# Patient Record
Sex: Female | Born: 1985 | Race: White | Hispanic: No | Marital: Married
Health system: Southern US, Community
[De-identification: ages and names within clinical notes are randomized; demographics above are authoritative.]

## PROBLEM LIST (undated history)

## (undated) ENCOUNTER — Inpatient Hospital Stay (HOSPITAL_COMMUNITY): Payer: Self-pay

## (undated) DIAGNOSIS — D497 Neoplasm of unspecified behavior of endocrine glands and other parts of nervous system: Secondary | ICD-10-CM

## (undated) DIAGNOSIS — I471 Supraventricular tachycardia: Secondary | ICD-10-CM

## (undated) HISTORY — PX: KNEE SURGERY: SHX244

## (undated) HISTORY — PX: TONSILLECTOMY: SUR1361

## (undated) HISTORY — PX: OTHER SURGICAL HISTORY: SHX169

---
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
## 2012-09-09 ENCOUNTER — Inpatient Hospital Stay (HOSPITAL_COMMUNITY)
Admission: AD | Admit: 2012-09-09 | Discharge: 2012-09-09 | Disposition: A | Discharge status: Home / Self Care | Payer: BC Managed Care – PPO | Source: Ambulatory Visit | Attending: Obstetrics and Gynecology | Admitting: Obstetrics and Gynecology

## 2012-09-09 ENCOUNTER — Inpatient Hospital Stay (HOSPITAL_COMMUNITY): Payer: BC Managed Care – PPO

## 2012-09-09 ENCOUNTER — Encounter (HOSPITAL_COMMUNITY): Payer: Self-pay

## 2012-09-09 DIAGNOSIS — O34599 Maternal care for other abnormalities of gravid uterus, unspecified trimester: Secondary | ICD-10-CM | POA: Insufficient documentation

## 2012-09-09 DIAGNOSIS — N83299 Other ovarian cyst, unspecified side: Secondary | ICD-10-CM

## 2012-09-09 DIAGNOSIS — N83209 Unspecified ovarian cyst, unspecified side: Secondary | ICD-10-CM | POA: Insufficient documentation

## 2012-09-09 DIAGNOSIS — Z298 Encounter for other specified prophylactic measures: Secondary | ICD-10-CM | POA: Insufficient documentation

## 2012-09-09 DIAGNOSIS — O4690 Antepartum hemorrhage, unspecified, unspecified trimester: Secondary | ICD-10-CM

## 2012-09-09 DIAGNOSIS — Z2989 Encounter for other specified prophylactic measures: Secondary | ICD-10-CM | POA: Insufficient documentation

## 2012-09-09 HISTORY — DX: Supraventricular tachycardia: I47.1

## 2012-09-09 HISTORY — DX: Neoplasm of unspecified behavior of endocrine glands and other parts of nervous system: D49.7

## 2012-09-09 LAB — CBC
Hemoglobin: 11.6 g/dL — ABNORMAL LOW (ref 12.0–15.0)
MCH: 29.4 pg (ref 26.0–34.0)
MCHC: 35 g/dL (ref 30.0–36.0)
MCV: 84 fL (ref 78.0–100.0)
RBC: 3.94 MIL/uL (ref 3.87–5.11)

## 2012-09-09 LAB — URINALYSIS, ROUTINE W REFLEX MICROSCOPIC
Bilirubin Urine: NEGATIVE
Hgb urine dipstick: NEGATIVE
Nitrite: NEGATIVE
Specific Gravity, Urine: <1.005 — ABNORMAL LOW (ref 1.005–1.030)
Urobilinogen, UA: 0.2 mg/dL (ref 0.0–1.0)
pH: 5.5 (ref 5.0–8.0)

## 2012-09-09 LAB — WET PREP, GENITAL
Trich, Wet Prep: NONE SEEN
Yeast Wet Prep HPF POC: NONE SEEN

## 2012-09-09 LAB — POCT PREGNANCY, URINE: Preg Test, Ur: POSITIVE — AB

## 2012-09-09 MED ORDER — RHO D IMMUNE GLOBULIN 1500 UNIT/2ML IJ SOLN: 300.0000 ug | Freq: Once | INTRAMUSCULAR | Status: DC

## 2012-09-09 MED ORDER — RHO D IMMUNE GLOBULIN 1500 UNIT/2ML IJ SOLN
300.0000 ug | Freq: Once | INTRAMUSCULAR | Status: AC
  Administered 2012-09-09: 300 ug via INTRAMUSCULAR
  Filled 2012-09-09: qty 2

## 2012-09-09 NOTE — MAU Provider Note (Signed)
History     CSN: [id number]  Arrival date and time: 09/09/12 0101   First Provider Initiated Contact with Patient 09/09/12 0124      Chief Complaint  Patient presents with  . Vaginal Bleeding   HPI  Pt is here with report of vaginal bleeding today.  Bleeding is described as scant on tissue - dark, yellow brown.  Now the discharge is described as pink.  No report of vaginal odor.  Last intercourse greater than one week ago.  No UTI symptoms.  Pt has an initial appointment scheduled for 09/13/12.    Past Medical History  Diagnosis Date  . Pituitary tumor   . SVT (supraventricular tachycardia)     Past Surgical History  Procedure Date  . Catheter ablation   . Knee surgery   . Tonsillectomy     No family history on file.  History  Substance Use Topics  . Smoking status: Not on file  . Smokeless tobacco: Not on file  . Alcohol Use: No    Allergies: Allergies not on file  No prescriptions prior to admission    Review of Systems  Gastrointestinal: Positive for abdominal pain (mild, intermittent cramping).  Genitourinary:       Vaginal bleeding  All other systems reviewed and are negative.   Physical Exam   Blood pressure 116/77, pulse 113, temperature 98.2 F (36.8 C), temperature source Oral, resp. rate 16, height 5' 8.5" (1.74 m), weight 70.489 kg (155 lb 6.4 oz), last menstrual period 07/28/2012.  Physical Exam  Constitutional: She is oriented to person, place, and time. She appears well-developed and well-nourished. No distress.  HENT:  Head: Normocephalic.  Neck: Normal range of motion. Neck supple.  Cardiovascular: Normal rate, regular rhythm and normal heart sounds.   Respiratory: Effort normal and breath sounds normal. No respiratory distress.  GI: Soft. There is no tenderness.  Genitourinary: Uterus is enlarged. There is bleeding ( scant; os closed, no clots seen) around the vagina.  Musculoskeletal: Normal range of motion. She exhibits no edema.    Neurological: She is alert and oriented to person, place, and time.  Skin: Skin is warm and dry.    MAU Course  Procedures  Results for orders placed during the hospital encounter of 09/09/12 (from the past 24 hour(s))  URINALYSIS, ROUTINE W REFLEX MICROSCOPIC     Status: Abnormal   Collection Time   09/09/12  1:12 AM      Component Value Range   Color, Urine YELLOW  YELLOW   APPearance CLEAR  CLEAR   Specific Gravity, Urine <1.005 (*) 1.005 - 1.030   pH 5.5  5.0 - 8.0   Glucose, UA NEGATIVE  NEGATIVE mg/dL   Hgb urine dipstick NEGATIVE  NEGATIVE   Bilirubin Urine NEGATIVE  NEGATIVE   Ketones, ur NEGATIVE  NEGATIVE mg/dL   Protein, ur NEGATIVE  NEGATIVE mg/dL   Urobilinogen, UA 0.2  0.0 - 1.0 mg/dL   Nitrite NEGATIVE  NEGATIVE   Leukocytes, UA NEGATIVE  NEGATIVE  POCT PREGNANCY, URINE     Status: Abnormal   Collection Time   09/09/12  1:23 AM      Component Value Range   Preg Test, Ur POSITIVE (*) NEGATIVE  WET PREP, GENITAL     Status: Abnormal   Collection Time   09/09/12  1:30 AM      Component Value Range   Yeast Wet Prep HPF POC NONE SEEN  NONE SEEN   Trich, Wet Prep NONE SEEN  NONE SEEN   Clue Cells Wet Prep HPF POC NONE SEEN  NONE SEEN   WBC, Wet Prep HPF POC FEW (*) NONE SEEN  CBC     Status: Abnormal   Collection Time   09/09/12  1:35 AM      Component Value Range   WBC 7.3  4.0 - 10.5 K/uL   RBC 3.94  3.87 - 5.11 MIL/uL   Hemoglobin 11.6 (*) 12.0 - 15.0 g/dL   HCT 45.4 (*) 09.8 - 11.9 %   MCV 84.0  78.0 - 100.0 fL   MCH 29.4  26.0 - 34.0 pg   MCHC 35.0  30.0 - 36.0 g/dL   RDW 14.7  82.9 - 56.2 %   Platelets 227  150 - 400 K/uL  HCG, QUANTITATIVE, PREGNANCY     Status: Abnormal   Collection Time   09/09/12  1:35 AM      Component Value Range   hCG, Beta Chain, Quant, S 537 (*) <5 mIU/mL  ABO/RH     Status: Normal   Collection Time   09/09/12  1:35 AM      Component Value Range   ABO/RH(D) A NEG    RH IG WORKUP (INCLUDES ABO/RH)     Status: Normal  (Preliminary result)   Collection Time   09/09/12  1:35 AM      Component Value Range   Gestational Age(Wks) 6     ABO/RH(D) A NEG     Antibody Screen NEG     Unit Number [id number]     Blood Component Type RHIG     Unit division 00     Status of Unit ALLOCATED     Transfusion Status OK TO TRANSFUSE     Ultrasound: IMPRESSION:  Single possible small intrauterine gestational sac noted, with a  mean sac diameter of 3 mm, corresponding to a gestational age of [redacted]  weeks 6 days. This does not match the gestational age of [redacted] weeks 2  days by LMP, though it does match the quantitative beta HCG level  of 537. No yolk sac or embryo yet seen.  There is a large mildly complex cyst at the right ovary, measuring  approximately 6.8 x 5.6 x 5.6 cm, demonstrating mild thin  septations. No associated blood flow is seen. The right ovary  measures approximately 7.7 x 5.6 x 6.2 cm. The left ovary measures  3.8 x 1.1 x 2.2 cm, and is unremarkable in appearance    Assessment and Plan  Right Ovarian Cyst Intrauterine Pregnancy RH negative  Plan:   Consulted with Dr. Dareen Piano > have pt follow-up next week Rhogam IM Bleeding precautions [facility]   [facility] 09/09/2012, 1:25 AM

## 2012-09-10 LAB — RH IG WORKUP (INCLUDES ABO/RH)
ABO/RH(D): A NEG
Antibody Screen: NEGATIVE
Unit division: 0

## 2013-07-15 ENCOUNTER — Encounter (HOSPITAL_COMMUNITY): Payer: Self-pay | Admitting: *Deleted

## 2014-08-11 ENCOUNTER — Encounter (HOSPITAL_COMMUNITY): Payer: Self-pay | Admitting: *Deleted

## 2016-03-09 ENCOUNTER — Telehealth (HOSPITAL_COMMUNITY): Payer: Self-pay | Admitting: Lactation Services

## 2016-03-09 NOTE — Telephone Encounter (Signed)
Dr. Monna Fam of [place] called to see how he could further assist his patient's breastfeeding mother, who is 3 months postpartum, and is experiencing an overactive letdown. Dr. Janann Colonel felt that patient's mother, Glenda Hines,  would be amenable to receiving a call from lactation and making an LC appt.  I called & spoke w/Mom, who says that "Mallie Mussel" chokes and refuses the breast at feedings except at night. Mom has tried laid-back nursing without success. She did use a nipple shield, which worked for 1 week, but then it lost its efficacy. An appt was made for Thursday, June 8th, at [place] has had excellent weight gain. He is 84 months old & is 7 lbs above birth weight. Mom can pump 4-5 oz off of one breast & Mom says Mallie Mussel never needs to go to the 2nd side. Mom admits that a small component of the difficulty in feeding Mallie Mussel was that she thought that he needed to eat "every couple of hours." I reassured Mom that in feeding situations such as this, infants may easily go 4 hrs between feedings.   We were unable to finish our phone conversation secondary to [place] beginning to cry while they were riding in the car, but Mom gave me permission to call her back. I did call her back about 1.5 hours later, but could not reach her. I left a message welcoming her to call me back, if she desired.   Elinor Dodge, RN, IBCLC

## 2016-03-17 ENCOUNTER — Ambulatory Visit (HOSPITAL_COMMUNITY)
Admission: RE | Admit: 2016-03-17 | Discharge: 2016-03-17 | Disposition: A | Discharge status: Home / Self Care | Payer: BC Managed Care – PPO | Source: Ambulatory Visit | Attending: Obstetrics & Gynecology | Admitting: Obstetrics & Gynecology

## 2016-03-17 NOTE — Lactation Note (Signed)
Lactation Consult  Mother's reason for visit: help with managing over active letdown and latching on and off for more peaceful feedings  Visit Type:  Feeding assessment  Appointment Notes:  Quick let down , and baby chokes at the breast  Consult:  Initial Lactation Consultant:  Myer Haff  ___________________________________________________________________________________________________________________________________________  Mother's Name: Susann Givens Type of delivery:  Vaginal  Breastfeeding Experience:  1st baby  Maternal Medical Conditions:   Maternal Medications:  PNV   ________________________________________________________________________  Breastfeeding History (Post Discharge)  Frequency of breastfeeding: every day - sometimes every 3-4 hours  Duration of feeding:  30-45 mins   Supplementing - no  Pumping: DEBP Medela - occasionally when needed for over fullness   Infant Intake and Output Assessment  Voids:  6-8 24 hrs.  Color:  Clear yellow Stools:  3 n 24 hrs.  Color:  Yellow  ________________________________________________________________________  Maternal Breast Assessment  Breast:  Full Nipple:  Erect Pain level:  0 Pain interventions:  Expressed breast milk  _______________________________________________________________________ Feeding Assessment/Evaluation  Initial feeding assessment:  Infant's oral assessment:  Variance see LC note below     Pre-feed weight:  7634 g , 16.13 oz  Post-feed weight:   Amount transferre Amount supplemented:  60 ml EBM  Total amount pumped post feed:  Did not post pump   Total amount transferred:  ( didn't re-weight due to not latching long enough to count it has a a feeding.  Total supplement given:  60 ml    Lactation Impression:  1st baby for this mom -  72 / mom / grandmother at [facility] consult - last feeding at home per mom - 2-5 pm - on and off snack at the breast.  Mom reports she delivered  at [facility] , good breast changes and milk came in without problems with in 2 days post delivery.  Delivery 12/01/15 - BW 9-11oz , baby is now 62 months old last weight 6-12 oz at the [facility] - baby is gaining well - last week. Vaginal delivery without complications , and no issues with latching in the hospital.  @ home latching has been a struggle and once he settles down he latches .  Per mom often takes standing up , rocking him back and forth , giving him a pacifier to calm him down, wait until he is a alittle sleepy, easy  The pacifier out and latch him . The best he does to stay latched is 15 - 20 mins. He also gets choky with my quick let down.  Tried all sorts of bottle nipples and the "[place] bottle seems to work the best , but it takes a long time and the best grandmother has done is 2 oz for a feeding.  From what LC could gather from the conversation with mom and grandmother feedings at the breast are a challenging and that's why the consult was scheduled by mom.   Baby awake and calm to start , even with weight , undressing for feeding and oral assessment.  LC observed mom latching in the cradle position and baby latched for a short interval and then would pull off and get very fussy.  [place] assisted all 5 different breast  Feeding positions without success. Baby very fussy. But very calm and smiles when away from the breast,  LC even assisted mom to hand express of the 1st breast so let down once has strong and baby pulled away , gagging , and very fussy.  Per mom -  requested LC try a bottle - LC started with moms "[place] bottle " she brought from home" . Baby only would stay latched on the bottle  For short interval , gag and come off. LC changed to hospital yellow nipple , and he seemed to a llitle better and sustain latch for longer , then same  behavior noted.  [place] then tried a Special Medela feeder with a longer nipple and slowly Mallie Mussel took 60 ml. Occasionally during the feeding  started rolling tongue and loosing suction, . LC would re-position.  And he was able to stay calm. [place] suspects he has a short labial frenulum and a posterior short frenulum. He is able to raise his tongue , but not above the corners of the mouth and over the gum line slightly, also a high palate.   LC highly recommends this baby be assessed by and oral specialist - resource sheet given to mom and discussion took place at consult   Lactation Plan of Care :  Until this latching issue is figured out - if breast 2nd breast is still full release down to comfort.  Protect established milk supply  If breast are really full to start - release off the 2nd breast so Mallie Mussel is more comfortable latching.  Try NS for latching to slow down the let down  Special feeder as directed by [facility] at consult - seems to work well.  Consider exploring frenulum resource sheet handout.

## 2017-10-06 DIAGNOSIS — O09893 Supervision of other high risk pregnancies, third trimester: Secondary | ICD-10-CM | POA: Diagnosis not present

## 2017-10-06 DIAGNOSIS — O0993 Supervision of high risk pregnancy, unspecified, third trimester: Secondary | ICD-10-CM | POA: Diagnosis not present

## 2017-10-06 DIAGNOSIS — Z6791 Unspecified blood type, Rh negative: Secondary | ICD-10-CM | POA: Diagnosis not present

## 2017-10-12 DIAGNOSIS — O99810 Abnormal glucose complicating pregnancy: Secondary | ICD-10-CM | POA: Diagnosis not present

## 2017-11-23 DIAGNOSIS — O2623 Pregnancy care for patient with recurrent pregnancy loss, third trimester: Secondary | ICD-10-CM | POA: Diagnosis not present

## 2017-11-23 DIAGNOSIS — O0993 Supervision of high risk pregnancy, unspecified, third trimester: Secondary | ICD-10-CM | POA: Diagnosis not present

## 2017-11-23 DIAGNOSIS — Z3689 Encounter for other specified antenatal screening: Secondary | ICD-10-CM | POA: Diagnosis not present

## 2017-11-23 DIAGNOSIS — Z3A36 36 weeks gestation of pregnancy: Secondary | ICD-10-CM | POA: Diagnosis not present

## 2017-11-23 DIAGNOSIS — Z6791 Unspecified blood type, Rh negative: Secondary | ICD-10-CM | POA: Diagnosis not present

## 2017-11-23 DIAGNOSIS — O09893 Supervision of other high risk pregnancies, third trimester: Secondary | ICD-10-CM | POA: Diagnosis not present

## 2017-12-14 DIAGNOSIS — O320XX0 Maternal care for unstable lie, not applicable or unspecified: Secondary | ICD-10-CM | POA: Diagnosis not present

## 2017-12-28 DIAGNOSIS — O480 Post-term pregnancy: Secondary | ICD-10-CM | POA: Diagnosis not present

## 2017-12-28 DIAGNOSIS — Z3A41 41 weeks gestation of pregnancy: Secondary | ICD-10-CM | POA: Diagnosis not present

## 2017-12-29 DIAGNOSIS — E668 Other obesity: Secondary | ICD-10-CM | POA: Diagnosis not present

## 2017-12-29 DIAGNOSIS — Z9229 Personal history of other drug therapy: Secondary | ICD-10-CM | POA: Diagnosis not present

## 2017-12-29 DIAGNOSIS — E669 Obesity, unspecified: Secondary | ICD-10-CM | POA: Diagnosis not present

## 2017-12-29 DIAGNOSIS — Z3A41 41 weeks gestation of pregnancy: Secondary | ICD-10-CM | POA: Diagnosis not present

## 2017-12-29 DIAGNOSIS — O99214 Obesity complicating childbirth: Secondary | ICD-10-CM | POA: Diagnosis not present

## 2017-12-29 DIAGNOSIS — O480 Post-term pregnancy: Secondary | ICD-10-CM | POA: Diagnosis not present

## 2017-12-29 DIAGNOSIS — Z6831 Body mass index (BMI) 31.0-31.9, adult: Secondary | ICD-10-CM | POA: Diagnosis not present

## 2017-12-29 DIAGNOSIS — O3663X0 Maternal care for excessive fetal growth, third trimester, not applicable or unspecified: Secondary | ICD-10-CM | POA: Diagnosis not present

## 2017-12-29 DIAGNOSIS — O09899 Supervision of other high risk pregnancies, unspecified trimester: Secondary | ICD-10-CM | POA: Diagnosis not present

## 2017-12-29 DIAGNOSIS — Z6791 Unspecified blood type, Rh negative: Secondary | ICD-10-CM | POA: Diagnosis not present

## 2017-12-29 DIAGNOSIS — Z6711 Type A blood, Rh negative: Secondary | ICD-10-CM | POA: Diagnosis not present

## 2017-12-29 DIAGNOSIS — O26893 Other specified pregnancy related conditions, third trimester: Secondary | ICD-10-CM | POA: Diagnosis not present

## 2018-02-07 DIAGNOSIS — Z1331 Encounter for screening for depression: Secondary | ICD-10-CM | POA: Diagnosis not present

## 2018-03-21 DIAGNOSIS — O9279 Other disorders of lactation: Secondary | ICD-10-CM | POA: Diagnosis not present

## 2018-06-07 DIAGNOSIS — D225 Melanocytic nevi of trunk: Secondary | ICD-10-CM | POA: Diagnosis not present

## 2018-06-07 DIAGNOSIS — D485 Neoplasm of uncertain behavior of skin: Secondary | ICD-10-CM | POA: Diagnosis not present

## 2018-06-07 DIAGNOSIS — D1801 Hemangioma of skin and subcutaneous tissue: Secondary | ICD-10-CM | POA: Diagnosis not present

## 2018-09-12 DIAGNOSIS — H0014 Chalazion left upper eyelid: Secondary | ICD-10-CM | POA: Diagnosis not present

## 2018-10-05 DIAGNOSIS — D485 Neoplasm of uncertain behavior of skin: Secondary | ICD-10-CM | POA: Diagnosis not present

## 2019-07-11 DIAGNOSIS — Z1151 Encounter for screening for human papillomavirus (HPV): Secondary | ICD-10-CM | POA: Diagnosis not present

## 2019-07-11 DIAGNOSIS — Z01411 Encounter for gynecological examination (general) (routine) with abnormal findings: Secondary | ICD-10-CM | POA: Diagnosis not present

## 2019-07-11 DIAGNOSIS — N858 Other specified noninflammatory disorders of uterus: Secondary | ICD-10-CM | POA: Diagnosis not present

## 2019-07-11 DIAGNOSIS — Z124 Encounter for screening for malignant neoplasm of cervix: Secondary | ICD-10-CM | POA: Diagnosis not present

## 2019-07-11 DIAGNOSIS — N925 Other specified irregular menstruation: Secondary | ICD-10-CM | POA: Diagnosis not present

## 2019-07-11 DIAGNOSIS — N949 Unspecified condition associated with female genital organs and menstrual cycle: Secondary | ICD-10-CM | POA: Diagnosis not present

## 2019-09-03 DIAGNOSIS — Z03818 Encounter for observation for suspected exposure to other biological agents ruled out: Secondary | ICD-10-CM | POA: Diagnosis not present

## 2019-09-13 DIAGNOSIS — N8111 Cystocele, midline: Secondary | ICD-10-CM | POA: Diagnosis not present

## 2019-09-13 DIAGNOSIS — N898 Other specified noninflammatory disorders of vagina: Secondary | ICD-10-CM | POA: Diagnosis not present

## 2019-09-13 DIAGNOSIS — N941 Unspecified dyspareunia: Secondary | ICD-10-CM | POA: Diagnosis not present

## 2019-11-12 DIAGNOSIS — Z3009 Encounter for other general counseling and advice on contraception: Secondary | ICD-10-CM | POA: Diagnosis not present

## 2020-01-21 ENCOUNTER — Inpatient Hospital Stay (HOSPITAL_COMMUNITY)
Admission: EM | Admit: 2020-01-21 | Discharge: 2020-01-23 | DRG: 023 | Disposition: A | Discharge status: Home / Self Care | Payer: BC Managed Care – PPO | Attending: Neurology | Admitting: Neurology

## 2020-01-21 ENCOUNTER — Inpatient Hospital Stay (HOSPITAL_COMMUNITY): Payer: BC Managed Care – PPO

## 2020-01-21 ENCOUNTER — Emergency Department (HOSPITAL_COMMUNITY): Payer: BC Managed Care – PPO

## 2020-01-21 ENCOUNTER — Encounter (HOSPITAL_COMMUNITY): Payer: Self-pay | Admitting: Neurology

## 2020-01-21 ENCOUNTER — Inpatient Hospital Stay (HOSPITAL_COMMUNITY): Payer: BC Managed Care – PPO | Admitting: Certified Registered Nurse Anesthetist

## 2020-01-21 ENCOUNTER — Encounter (HOSPITAL_COMMUNITY)
Admission: EM | Disposition: A | Discharge status: Home / Self Care | Payer: Self-pay | Source: Home / Self Care | Attending: Neurology

## 2020-01-21 DIAGNOSIS — I639 Cerebral infarction, unspecified: Secondary | ICD-10-CM

## 2020-01-21 DIAGNOSIS — R29818 Other symptoms and signs involving the nervous system: Secondary | ICD-10-CM | POA: Diagnosis not present

## 2020-01-21 DIAGNOSIS — G936 Cerebral edema: Secondary | ICD-10-CM | POA: Diagnosis present

## 2020-01-21 DIAGNOSIS — Z793 Long term (current) use of hormonal contraceptives: Secondary | ICD-10-CM | POA: Diagnosis not present

## 2020-01-21 DIAGNOSIS — I63511 Cerebral infarction due to unspecified occlusion or stenosis of right middle cerebral artery: Secondary | ICD-10-CM | POA: Diagnosis not present

## 2020-01-21 DIAGNOSIS — R9431 Abnormal electrocardiogram [ECG] [EKG]: Secondary | ICD-10-CM | POA: Diagnosis not present

## 2020-01-21 DIAGNOSIS — Z9889 Other specified postprocedural states: Secondary | ICD-10-CM | POA: Diagnosis not present

## 2020-01-21 DIAGNOSIS — G8194 Hemiplegia, unspecified affecting left nondominant side: Secondary | ICD-10-CM | POA: Diagnosis not present

## 2020-01-21 DIAGNOSIS — Z882 Allergy status to sulfonamides status: Secondary | ICD-10-CM | POA: Diagnosis not present

## 2020-01-21 DIAGNOSIS — R414 Neurologic neglect syndrome: Secondary | ICD-10-CM | POA: Diagnosis present

## 2020-01-21 DIAGNOSIS — R4781 Slurred speech: Secondary | ICD-10-CM | POA: Diagnosis not present

## 2020-01-21 DIAGNOSIS — I63521 Cerebral infarction due to unspecified occlusion or stenosis of right anterior cerebral artery: Secondary | ICD-10-CM | POA: Diagnosis not present

## 2020-01-21 DIAGNOSIS — H539 Unspecified visual disturbance: Secondary | ICD-10-CM | POA: Diagnosis not present

## 2020-01-21 DIAGNOSIS — I609 Nontraumatic subarachnoid hemorrhage, unspecified: Secondary | ICD-10-CM | POA: Diagnosis present

## 2020-01-21 DIAGNOSIS — I6389 Other cerebral infarction: Secondary | ICD-10-CM | POA: Diagnosis not present

## 2020-01-21 DIAGNOSIS — I6601 Occlusion and stenosis of right middle cerebral artery: Secondary | ICD-10-CM | POA: Diagnosis not present

## 2020-01-21 DIAGNOSIS — Z881 Allergy status to other antibiotic agents status: Secondary | ICD-10-CM | POA: Diagnosis not present

## 2020-01-21 DIAGNOSIS — Z20822 Contact with and (suspected) exposure to covid-19: Secondary | ICD-10-CM | POA: Diagnosis present

## 2020-01-21 DIAGNOSIS — Z91048 Other nonmedicinal substance allergy status: Secondary | ICD-10-CM

## 2020-01-21 DIAGNOSIS — Z91018 Allergy to other foods: Secondary | ICD-10-CM | POA: Diagnosis not present

## 2020-01-21 DIAGNOSIS — Z885 Allergy status to narcotic agent status: Secondary | ICD-10-CM | POA: Diagnosis not present

## 2020-01-21 DIAGNOSIS — Z79899 Other long term (current) drug therapy: Secondary | ICD-10-CM | POA: Diagnosis not present

## 2020-01-21 DIAGNOSIS — Q211 Atrial septal defect: Secondary | ICD-10-CM

## 2020-01-21 DIAGNOSIS — I340 Nonrheumatic mitral (valve) insufficiency: Secondary | ICD-10-CM | POA: Diagnosis not present

## 2020-01-21 DIAGNOSIS — R29810 Facial weakness: Secondary | ICD-10-CM | POA: Diagnosis not present

## 2020-01-21 DIAGNOSIS — I63411 Cerebral infarction due to embolism of right middle cerebral artery: Secondary | ICD-10-CM | POA: Diagnosis not present

## 2020-01-21 DIAGNOSIS — I959 Hypotension, unspecified: Secondary | ICD-10-CM | POA: Diagnosis not present

## 2020-01-21 DIAGNOSIS — Q2112 Patent foramen ovale: Secondary | ICD-10-CM

## 2020-01-21 DIAGNOSIS — R29714 NIHSS score 14: Secondary | ICD-10-CM | POA: Diagnosis not present

## 2020-01-21 DIAGNOSIS — R531 Weakness: Secondary | ICD-10-CM

## 2020-01-21 HISTORY — PX: RADIOLOGY WITH ANESTHESIA: SHX6223

## 2020-01-21 HISTORY — PX: IR CT HEAD LTD: IMG2386

## 2020-01-21 HISTORY — PX: IR PERCUTANEOUS ART THROMBECTOMY/INFUSION INTRACRANIAL INC DIAG ANGIO: IMG6087

## 2020-01-21 LAB — COMPREHENSIVE METABOLIC PANEL
ALT: 15 U/L (ref 0–44)
AST: 17 U/L (ref 15–41)
Albumin: 3.6 g/dL (ref 3.5–5.0)
Alkaline Phosphatase: 38 U/L (ref 38–126)
Anion gap: 10 (ref 5–15)
BUN: 10 mg/dL (ref 6–20)
CO2: 23 mmol/L (ref 22–32)
Calcium: 8.8 mg/dL — ABNORMAL LOW (ref 8.9–10.3)
Chloride: 105 mmol/L (ref 98–111)
Creatinine, Ser: 0.84 mg/dL (ref 0.44–1.00)
GFR calc Af Amer: >60 mL/min (ref >60)
GFR calc non Af Amer: >60 mL/min (ref >60)
Glucose, Bld: 154 mg/dL — ABNORMAL HIGH (ref 70–99)
Potassium: 3.6 mmol/L (ref 3.5–5.1)
Sodium: 138 mmol/L (ref 135–145)
Total Bilirubin: 0.7 mg/dL (ref 0.3–1.2)
Total Protein: 7 g/dL (ref 6.5–8.1)

## 2020-01-21 LAB — CBC
HCT: 38.5 % (ref 36.0–46.0)
Hemoglobin: 12.9 g/dL (ref 12.0–15.0)
MCH: 29.6 pg (ref 26.0–34.0)
MCHC: 33.5 g/dL (ref 30.0–36.0)
MCV: 88.3 fL (ref 80.0–100.0)
Platelets: 273 10*3/uL (ref 150–400)
RBC: 4.36 MIL/uL (ref 3.87–5.11)
RDW: 12.4 % (ref 11.5–15.5)
WBC: 5 10*3/uL (ref 4.0–10.5)
nRBC: 0 % (ref 0.0–0.2)

## 2020-01-21 LAB — I-STAT CHEM 8, ED
BUN: 11 mg/dL (ref 6–20)
Calcium, Ion: 1.15 mmol/L (ref 1.15–1.40)
Chloride: 105 mmol/L (ref 98–111)
Creatinine, Ser: 0.7 mg/dL (ref 0.44–1.00)
Glucose, Bld: 148 mg/dL — ABNORMAL HIGH (ref 70–99)
HCT: 36 % (ref 36.0–46.0)
Hemoglobin: 12.2 g/dL (ref 12.0–15.0)
Potassium: 3.7 mmol/L (ref 3.5–5.1)
Sodium: 140 mmol/L (ref 135–145)
TCO2: 26 mmol/L (ref 22–32)

## 2020-01-21 LAB — PROTIME-INR
INR: 1 (ref 0.8–1.2)
Prothrombin Time: 13.2 seconds (ref 11.4–15.2)

## 2020-01-21 LAB — I-STAT BETA HCG BLOOD, ED (MC, WL, AP ONLY): I-stat hCG, quantitative: <5 m[IU]/mL (ref <5)

## 2020-01-21 LAB — RESPIRATORY PANEL BY RT PCR (FLU A&B, COVID)
Influenza A by PCR: NEGATIVE
Influenza B by PCR: NEGATIVE
SARS Coronavirus 2 by RT PCR: NEGATIVE

## 2020-01-21 LAB — DIFFERENTIAL
Abs Immature Granulocytes: 0.01 10*3/uL (ref 0.00–0.07)
Basophils Absolute: 0 10*3/uL (ref 0.0–0.1)
Basophils Relative: 0 %
Eosinophils Absolute: 0.1 10*3/uL (ref 0.0–0.5)
Eosinophils Relative: 2 %
Immature Granulocytes: 0 %
Lymphocytes Relative: 40 %
Lymphs Abs: 2 10*3/uL (ref 0.7–4.0)
Monocytes Absolute: 0.4 10*3/uL (ref 0.1–1.0)
Monocytes Relative: 7 %
Neutro Abs: 2.6 10*3/uL (ref 1.7–7.7)
Neutrophils Relative %: 51 %

## 2020-01-21 LAB — ECHOCARDIOGRAM COMPLETE
Height: 67.5 in
Weight: 2959.46 oz

## 2020-01-21 LAB — CBG MONITORING, ED: Glucose-Capillary: 126 mg/dL — ABNORMAL HIGH (ref 70–99)

## 2020-01-21 LAB — APTT: aPTT: 24 seconds (ref 24–36)

## 2020-01-21 LAB — MRSA PCR SCREENING: MRSA by PCR: NEGATIVE

## 2020-01-21 SURGERY — RADIOLOGY WITH ANESTHESIA
Anesthesia: General

## 2020-01-21 MED ORDER — PHENYLEPHRINE 40 MCG/ML (10ML) SYRINGE FOR IV PUSH (FOR BLOOD PRESSURE SUPPORT)
PREFILLED_SYRINGE | INTRAVENOUS | Status: DC | PRN
  Administered 2020-01-21 (×6): 40 ug via INTRAVENOUS

## 2020-01-21 MED ORDER — LORAZEPAM 2 MG/ML IJ SOLN
INTRAMUSCULAR | Status: AC
  Administered 2020-01-21: 0.5 mg
  Filled 2020-01-21: qty 1

## 2020-01-21 MED ORDER — CHLORHEXIDINE GLUCONATE CLOTH 2 % EX PADS
6.0000 | MEDICATED_PAD | Freq: Every day | CUTANEOUS | Status: DC
  Administered 2020-01-21 – 2020-01-23 (×3): 6 via TOPICAL

## 2020-01-21 MED ORDER — FENTANYL CITRATE (PF) 250 MCG/5ML IJ SOLN
INTRAMUSCULAR | Status: DC | PRN
  Administered 2020-01-21 (×2): 50 ug via INTRAVENOUS

## 2020-01-21 MED ORDER — VERAPAMIL HCL 2.5 MG/ML IV SOLN
INTRAVENOUS | Status: DC | PRN
  Administered 2020-01-21 (×3): 5 mg via INTRA_ARTERIAL

## 2020-01-21 MED ORDER — ACETAMINOPHEN 650 MG RE SUPP: 650.0000 mg | RECTAL | Status: DC | PRN

## 2020-01-21 MED ORDER — SODIUM CHLORIDE 0.9 % IV SOLN: INTRAVENOUS | Status: DC

## 2020-01-21 MED ORDER — IOHEXOL 240 MG/ML SOLN
INTRAMUSCULAR | Status: AC
  Filled 2020-01-21: qty 100

## 2020-01-21 MED ORDER — IOHEXOL 300 MG/ML  SOLN
50.0000 mL | Freq: Once | INTRAMUSCULAR | Status: AC | PRN
  Administered 2020-01-21: 30 mL via INTRA_ARTERIAL

## 2020-01-21 MED ORDER — TICAGRELOR 90 MG PO TABS
ORAL_TABLET | ORAL | Status: AC
  Filled 2020-01-21: qty 2

## 2020-01-21 MED ORDER — ASPIRIN 300 MG RE SUPP: 300.0000 mg | Freq: Every day | RECTAL | Status: DC

## 2020-01-21 MED ORDER — VERAPAMIL HCL 2.5 MG/ML IV SOLN
INTRAVENOUS | Status: AC
  Filled 2020-01-21: qty 2

## 2020-01-21 MED ORDER — ROCURONIUM BROMIDE 10 MG/ML (PF) SYRINGE
PREFILLED_SYRINGE | INTRAVENOUS | Status: DC | PRN
  Administered 2020-01-21: 100 mg via INTRAVENOUS

## 2020-01-21 MED ORDER — ACETAMINOPHEN 325 MG PO TABS
650.0000 mg | ORAL_TABLET | ORAL | Status: DC | PRN
  Administered 2020-01-22 (×2): 650 mg via ORAL
  Filled 2020-01-21 (×2): qty 2

## 2020-01-21 MED ORDER — IOHEXOL 240 MG/ML SOLN
150.0000 mL | Freq: Once | INTRAMUSCULAR | Status: AC | PRN
  Administered 2020-01-21: 70 mL via INTRA_ARTERIAL

## 2020-01-21 MED ORDER — PROPOFOL 10 MG/ML IV BOLUS
INTRAVENOUS | Status: DC | PRN
  Administered 2020-01-21: 150 mg via INTRAVENOUS
  Administered 2020-01-21: 100 mg via INTRAVENOUS
  Administered 2020-01-21: 50 mg via INTRAVENOUS

## 2020-01-21 MED ORDER — STROKE: EARLY STAGES OF RECOVERY BOOK
Freq: Once | Status: DC
  Filled 2020-01-21: qty 1

## 2020-01-21 MED ORDER — NICARDIPINE HCL IN NACL 20-0.86 MG/200ML-% IV SOLN: 0.0000 mg/h | INTRAVENOUS | Status: DC

## 2020-01-21 MED ORDER — ASPIRIN 325 MG PO TABS
325.0000 mg | ORAL_TABLET | Freq: Every day | ORAL | Status: DC
Start: 2020-01-22 — End: 2020-01-21

## 2020-01-21 MED ORDER — PHENYLEPHRINE HCL-NACL 10-0.9 MG/250ML-% IV SOLN
INTRAVENOUS | Status: DC | PRN
  Administered 2020-01-21: 10 ug/min via INTRAVENOUS

## 2020-01-21 MED ORDER — LACTATED RINGERS IV SOLN: INTRAVENOUS | Status: DC | PRN

## 2020-01-21 MED ORDER — ACETAMINOPHEN 160 MG/5ML PO SOLN: 650.0000 mg | ORAL | Status: DC | PRN

## 2020-01-21 MED ORDER — SODIUM CHLORIDE 0.9% FLUSH
3.0000 mL | Freq: Once | INTRAVENOUS | Status: DC
Start: 2020-01-21 — End: 2020-01-23

## 2020-01-21 MED ORDER — ASPIRIN 81 MG PO CHEW
CHEWABLE_TABLET | ORAL | Status: AC
  Filled 2020-01-21: qty 1

## 2020-01-21 MED ORDER — CLOPIDOGREL BISULFATE 300 MG PO TABS
ORAL_TABLET | ORAL | Status: AC
  Filled 2020-01-21: qty 1

## 2020-01-21 MED ORDER — SUCCINYLCHOLINE CHLORIDE 200 MG/10ML IV SOSY
PREFILLED_SYRINGE | INTRAVENOUS | Status: DC | PRN
  Administered 2020-01-21: 120 mg via INTRAVENOUS

## 2020-01-21 MED ORDER — ASPIRIN 81 MG PO CHEW: 324.0000 mg | CHEWABLE_TABLET | ORAL | Status: DC

## 2020-01-21 MED ORDER — EPHEDRINE SULFATE-NACL 50-0.9 MG/10ML-% IV SOSY
PREFILLED_SYRINGE | INTRAVENOUS | Status: DC | PRN
  Administered 2020-01-21: 5 mg via INTRAVENOUS

## 2020-01-21 MED ORDER — ROCURONIUM BROMIDE 10 MG/ML (PF) SYRINGE: PREFILLED_SYRINGE | INTRAVENOUS | Status: DC | PRN

## 2020-01-21 MED ORDER — ONDANSETRON HCL 4 MG/2ML IJ SOLN
INTRAMUSCULAR | Status: DC | PRN
  Administered 2020-01-21: 4 mg via INTRAVENOUS

## 2020-01-21 MED ORDER — SUGAMMADEX SODIUM 200 MG/2ML IV SOLN
INTRAVENOUS | Status: DC | PRN
  Administered 2020-01-21: 200 mg via INTRAVENOUS

## 2020-01-21 MED ORDER — IOHEXOL 350 MG/ML SOLN
100.0000 mL | Freq: Once | INTRAVENOUS | Status: AC | PRN
  Administered 2020-01-21: 100 mL via INTRAVENOUS

## 2020-01-21 MED ORDER — LORAZEPAM 2 MG/ML IJ SOLN
INTRAMUSCULAR | Status: DC | PRN
  Administered 2020-01-21: 1 mg via INTRAVENOUS

## 2020-01-21 MED ORDER — LIDOCAINE 2% (20 MG/ML) 5 ML SYRINGE
INTRAMUSCULAR | Status: DC | PRN
  Administered 2020-01-21: 60 mg via INTRAVENOUS

## 2020-01-21 MED ORDER — TIROFIBAN HCL IN NACL 5-0.9 MG/100ML-% IV SOLN
INTRAVENOUS | Status: AC
  Filled 2020-01-21: qty 100

## 2020-01-21 MED ORDER — DEXAMETHASONE SODIUM PHOSPHATE 10 MG/ML IJ SOLN
INTRAMUSCULAR | Status: DC | PRN
  Administered 2020-01-21: 10 mg via INTRAVENOUS

## 2020-01-21 MED ORDER — EPTIFIBATIDE 20 MG/10ML IV SOLN
INTRAVENOUS | Status: AC
  Filled 2020-01-21: qty 10

## 2020-01-21 NOTE — Anesthesia Postprocedure Evaluation (Signed)
Anesthesia Post Note  Patient: Glenda Hines  Procedure(s) Performed: RADIOLOGY WITH ANESTHESIA (N/A )     Patient location during evaluation: PACU Anesthesia Type: General Level of consciousness: awake and alert and patient cooperative Pain management: pain level controlled Vital Signs Assessment: post-procedure vital signs reviewed and stable Respiratory status: spontaneous breathing, nonlabored ventilation and respiratory function stable Cardiovascular status: blood pressure returned to baseline, stable and tachycardic Postop Assessment: no apparent nausea or vomiting Anesthetic complications: no Comments: Tachycardic at baseline, sinus rhythm. Moving right side well and B/L LEs. Still unable to move LUE, significant facial droop on left, tongue deviation to right. Normal mentation.    Last Vitals:  Vitals:   01/21/20 0930 01/21/20 1000  BP:  136/70  Pulse:  (!) 114  Resp:  (!) 25  SpO2: 96% 100%    Last Pain: There were no vitals filed for this visit.               Pervis Hocking

## 2020-01-21 NOTE — Progress Notes (Signed)
  Echocardiogram 2D Echocardiogram has been performed.  Glenda Hines 01/21/2020, 3:59 PM

## 2020-01-21 NOTE — ED Triage Notes (Signed)
Pt in from home via [facility] as code stroke. LSN last night at bedtime (2200). EMS states she woke at 0800 w/R side gaze, L side weakness and L facial droop. States she recently stopped birth control, denies any clotting disorders or other related hx. A&ox4 on arrival, shaking and c/o anxiety, speech clear. States her R neck muscle is painful w/movement

## 2020-01-21 NOTE — Procedures (Signed)
INTERVENTIONAL NEURORADIOLOGY BRIEF POSTPROCEDURE NOTE  Glenda Hines   Attending: Dr. Pedro Earls  Assistant: None.  Diagnosis: Right MCA and right ACA occlusion  Access site: Right common femoral artery  Access closure: Perclose Proglide  Anesthesia: General  Medication used: 15 mg verapamil IA.  Complications: Small right frontal SAH  Estimated blood loss: 167mL  Specimen: None  Findings: Multiple occlusions identified in M3 /MCA anterior division and M4 posterior division. There was also occlusion of the right pericallosal artery. Mechanical thrombectomy performed with stent retriever (1 pass) and 1 aspiration in a right M3 anterior division (pre interverntion: TICI 2B; post: no change). One aspiration pass performed in the right pericallosal with complete recanalization (pre interverntion: TICI 2B; post:TICI 3).  The patient tolerated the procedure well without incident or complication and is in stable condition.   Family communicated by phone.

## 2020-01-21 NOTE — ED Provider Notes (Signed)
[facility] Provider Note   CSN: [id number] Arrival date & time: 01/21/20  [id number]  An emergency department physician performed an initial assessment on this suspected stroke patient at 0927.  History Chief Complaint  Patient presents with  . Code Stroke    Glenda Hines is a 34 y.o. female.  34 yo F with a cc of left sided weakness.  Woke up this morning and felt funny.  Noted to have significant L sided weakness and neglect and was made a code stroke with concern for LVO.   The history is provided by the patient.       Past Medical History:  Diagnosis Date  . Pituitary tumor   . SVT (supraventricular tachycardia) (HCC)     There are no problems to display for this patient.   Past Surgical History:  Procedure Laterality Date  . catheter ablation    . KNEE SURGERY    . TONSILLECTOMY       OB History    Gravida  1   Para      Term      Preterm      AB      Living        SAB      TAB      Ectopic      Multiple      Live Births              Family History  Problem Relation Age of Onset  . Hypertension Mother   . Diabetes Father     Social History   Tobacco Use  . Smoking status: Not on file  Substance Use Topics  . Alcohol use: No  . Drug use: No    Home Medications Prior to Admission medications   Not on File    Allergies    Patient has no allergy information on record.  Review of Systems   Review of Systems  Constitutional: Negative for chills and fever.  HENT: Negative for congestion and rhinorrhea.   Eyes: Negative for redness and visual disturbance.  Respiratory: Negative for shortness of breath and wheezing.   Cardiovascular: Negative for chest pain and palpitations.  Gastrointestinal: Negative for nausea and vomiting.  Genitourinary: Negative for dysuria and urgency.  Musculoskeletal: Negative for arthralgias and myalgias.  Skin: Negative for pallor and wound.  Neurological:  Positive for weakness. Negative for dizziness and headaches.    Physical Exam Updated Vital Signs BP 136/70   Pulse (!) 114   Resp (!) 25   Ht 5' 8.5" (1.74 m)   SpO2 100%   BMI 23.28 kg/m   Physical Exam Vitals and nursing note reviewed.  Constitutional:      General: She is not in acute distress.    Appearance: She is well-developed. She is not diaphoretic.  HENT:     Head: Normocephalic and atraumatic.  Eyes:     Pupils: Pupils are equal, round, and reactive to light.  Cardiovascular:     Rate and Rhythm: Normal rate and regular rhythm.     Heart sounds: No murmur. No friction rub. No gallop.   Pulmonary:     Effort: Pulmonary effort is normal.     Breath sounds: No wheezing or rales.  Abdominal:     General: There is no distension.     Palpations: Abdomen is soft.     Tenderness: There is no abdominal tenderness.  Musculoskeletal:        General:  No tenderness.     Cervical back: Normal range of motion and neck supple.  Skin:    General: Skin is warm and dry.  Neurological:     Mental Status: She is alert and oriented to person, place, and time.     Comments: Left sided neglect, Left arm contracted.    Psychiatric:        Behavior: Behavior normal.     ED Results / Procedures / Treatments   Labs (all labs ordered are listed, but only abnormal results are displayed) Labs Reviewed  COMPREHENSIVE METABOLIC PANEL - Abnormal; Notable for the following components:      Result Value   Glucose, Bld 154 (*)    Calcium 8.8 (*)    All other components within normal limits  CBG MONITORING, ED - Abnormal; Notable for the following components:   Glucose-Capillary 126 (*)    All other components within normal limits  I-STAT CHEM 8, ED - Abnormal; Notable for the following components:   Glucose, Bld 148 (*)    All other components within normal limits  RESPIRATORY PANEL BY RT PCR (FLU A&B, COVID)  PROTIME-INR  APTT  CBC  DIFFERENTIAL  CBG MONITORING, ED  I-STAT  BETA HCG BLOOD, ED ([facility], [facility], [facility] ONLY)    EKG None  Radiology CT ANGIO HEAD W OR WO CONTRAST  Result Date: 01/21/2020 CLINICAL DATA:  Code stroke. EXAM: CT ANGIOGRAPHY HEAD AND NECK CT PERFUSION BRAIN TECHNIQUE: Multidetector CT imaging of the head and neck was performed using the standard protocol during bolus administration of intravenous contrast. Multiplanar CT image reconstructions and MIPs were obtained to evaluate the vascular anatomy. Carotid stenosis measurements (when applicable) are obtained utilizing NASCET criteria, using the distal internal carotid diameter as the denominator. Multiphase CT imaging of the brain was performed following IV bolus contrast injection. Subsequent parametric perfusion maps were calculated using RAPID software. CONTRAST:  147mL OMNIPAQUE IOHEXOL 350 MG/ML SOLN COMPARISON:  Noncontrast head CT earlier today FINDINGS: CTA NECK FINDINGS Aortic arch: Unremarkable Right carotid system: Vessels are smooth and widely patent. No atheromatous changes Left carotid system: This vessels are smooth and widely patent. No atheromatous changes. Vertebral arteries: No proximal subclavian stenosis. The vertebral arteries are smooth and widely patent. Skeleton: No acute finding Other neck: No acute finding Upper chest: Negative Review of the MIP images confirms the above findings CTA HEAD FINDINGS Anterior circulation: Irregularity along the anterior wall of the right MCA bifurcation with moderate stenosis. There is a right M3 branch occlusion seen subsequently. On MIPS there is operculum ir pruning suggesting additional peripheral emboli. Non enhancement of the right pericallosal artery. Posterior circulation: Head the vertebral and basilar arteries are smooth and widely patent. Fetal type left PCA. The posterior cerebral arteries are smooth and widely patent. Negative for aneurysm Venous sinuses: Negative Anatomic variants: As above Review of the MIP images confirms the above findings  CT Brain Perfusion Findings: ASPECTS: 7 or 8 CBF (<30%) Volume: 61mL-an underestimation Perfusion (Tmax>6.0s) volume: 130 primarily in the upper division right MCA territory but also seen in the distal right ACA territory and to a lesser extent in the left frontal pole. Mismatch Volume: Not applicable Critical Value/emergent results were called by telephone at the time of interpretation on 01/21/2020 at 9:53 am to provider Leonel Ramsay, who verbally acknowledged these results. IMPRESSION: 1. No emergent large vessel occlusion but there is irregular luminal thrombus at the right M1 segment causing moderate stenosis. 2. Right M3 branch occlusion adjacent to an  acute infarct by noncontrast CT. Probable right pericallosal embolism. 3. Underestimated core infarct by CT perfusion when compared with aspects. There is a 130 cc territory of delayed/ischemic blood flow mainly in the upper division right MCA territory and right ACA territory. 4. Small area of ischemic blood flow in the left frontal pole where there was suspected cortical infarct by noncontrast CT. 5. No stenosis or embolic source seen in the neck. Electronically Signed   By: Monte Fantasia M.D.   On: 01/21/2020 10:03   CT ANGIO NECK W OR WO CONTRAST  Result Date: 01/21/2020 CLINICAL DATA:  Code stroke. EXAM: CT ANGIOGRAPHY HEAD AND NECK CT PERFUSION BRAIN TECHNIQUE: Multidetector CT imaging of the head and neck was performed using the standard protocol during bolus administration of intravenous contrast. Multiplanar CT image reconstructions and MIPs were obtained to evaluate the vascular anatomy. Carotid stenosis measurements (when applicable) are obtained utilizing NASCET criteria, using the distal internal carotid diameter as the denominator. Multiphase CT imaging of the brain was performed following IV bolus contrast injection. Subsequent parametric perfusion maps were calculated using RAPID software. CONTRAST:  184mL OMNIPAQUE IOHEXOL 350 MG/ML SOLN  COMPARISON:  Noncontrast head CT earlier today FINDINGS: CTA NECK FINDINGS Aortic arch: Unremarkable Right carotid system: Vessels are smooth and widely patent. No atheromatous changes Left carotid system: This vessels are smooth and widely patent. No atheromatous changes. Vertebral arteries: No proximal subclavian stenosis. The vertebral arteries are smooth and widely patent. Skeleton: No acute finding Other neck: No acute finding Upper chest: Negative Review of the MIP images confirms the above findings CTA HEAD FINDINGS Anterior circulation: Irregularity along the anterior wall of the right MCA bifurcation with moderate stenosis. There is a right M3 branch occlusion seen subsequently. On MIPS there is operculum ir pruning suggesting additional peripheral emboli. Non enhancement of the right pericallosal artery. Posterior circulation: Head the vertebral and basilar arteries are smooth and widely patent. Fetal type left PCA. The posterior cerebral arteries are smooth and widely patent. Negative for aneurysm Venous sinuses: Negative Anatomic variants: As above Review of the MIP images confirms the above findings CT Brain Perfusion Findings: ASPECTS: 7 or 8 CBF (<30%) Volume: 40mL-an underestimation Perfusion (Tmax>6.0s) volume: 130 primarily in the upper division right MCA territory but also seen in the distal right ACA territory and to a lesser extent in the left frontal pole. Mismatch Volume: Not applicable Critical Value/emergent results were called by telephone at the time of interpretation on 01/21/2020 at 9:53 am to provider Leonel Ramsay, who verbally acknowledged these results. IMPRESSION: 1. No emergent large vessel occlusion but there is irregular luminal thrombus at the right M1 segment causing moderate stenosis. 2. Right M3 branch occlusion adjacent to an acute infarct by noncontrast CT. Probable right pericallosal embolism. 3. Underestimated core infarct by CT perfusion when compared with aspects. There is  a 130 cc territory of delayed/ischemic blood flow mainly in the upper division right MCA territory and right ACA territory. 4. Small area of ischemic blood flow in the left frontal pole where there was suspected cortical infarct by noncontrast CT. 5. No stenosis or embolic source seen in the neck. Electronically Signed   By: Monte Fantasia M.D.   On: 01/21/2020 10:03   CT CEREBRAL PERFUSION W CONTRAST  Result Date: 01/21/2020 CLINICAL DATA:  Code stroke. EXAM: CT ANGIOGRAPHY HEAD AND NECK CT PERFUSION BRAIN TECHNIQUE: Multidetector CT imaging of the head and neck was performed using the standard protocol during bolus administration of intravenous contrast. Multiplanar CT  image reconstructions and MIPs were obtained to evaluate the vascular anatomy. Carotid stenosis measurements (when applicable) are obtained utilizing NASCET criteria, using the distal internal carotid diameter as the denominator. Multiphase CT imaging of the brain was performed following IV bolus contrast injection. Subsequent parametric perfusion maps were calculated using RAPID software. CONTRAST:  178mL OMNIPAQUE IOHEXOL 350 MG/ML SOLN COMPARISON:  Noncontrast head CT earlier today FINDINGS: CTA NECK FINDINGS Aortic arch: Unremarkable Right carotid system: Vessels are smooth and widely patent. No atheromatous changes Left carotid system: This vessels are smooth and widely patent. No atheromatous changes. Vertebral arteries: No proximal subclavian stenosis. The vertebral arteries are smooth and widely patent. Skeleton: No acute finding Other neck: No acute finding Upper chest: Negative Review of the MIP images confirms the above findings CTA HEAD FINDINGS Anterior circulation: Irregularity along the anterior wall of the right MCA bifurcation with moderate stenosis. There is a right M3 branch occlusion seen subsequently. On MIPS there is operculum ir pruning suggesting additional peripheral emboli. Non enhancement of the right pericallosal  artery. Posterior circulation: Head the vertebral and basilar arteries are smooth and widely patent. Fetal type left PCA. The posterior cerebral arteries are smooth and widely patent. Negative for aneurysm Venous sinuses: Negative Anatomic variants: As above Review of the MIP images confirms the above findings CT Brain Perfusion Findings: ASPECTS: 7 or 8 CBF (<30%) Volume: 20mL-an underestimation Perfusion (Tmax>6.0s) volume: 130 primarily in the upper division right MCA territory but also seen in the distal right ACA territory and to a lesser extent in the left frontal pole. Mismatch Volume: Not applicable Critical Value/emergent results were called by telephone at the time of interpretation on 01/21/2020 at 9:53 am to provider Leonel Ramsay, who verbally acknowledged these results. IMPRESSION: 1. No emergent large vessel occlusion but there is irregular luminal thrombus at the right M1 segment causing moderate stenosis. 2. Right M3 branch occlusion adjacent to an acute infarct by noncontrast CT. Probable right pericallosal embolism. 3. Underestimated core infarct by CT perfusion when compared with aspects. There is a 130 cc territory of delayed/ischemic blood flow mainly in the upper division right MCA territory and right ACA territory. 4. Small area of ischemic blood flow in the left frontal pole where there was suspected cortical infarct by noncontrast CT. 5. No stenosis or embolic source seen in the neck. Electronically Signed   By: Monte Fantasia M.D.   On: 01/21/2020 10:03   CT HEAD CODE STROKE WO CONTRAST  Result Date: 01/21/2020 CLINICAL DATA:  Code stroke.  Code stroke with left-sided weakness EXAM: CT HEAD WITHOUT CONTRAST TECHNIQUE: Contiguous axial images were obtained from the base of the skull through the vertex without intravenous contrast. COMPARISON:  None. FINDINGS: Brain: Loss of gray-white differentiation along the lateral right frontal lobe and superficial right temporal lobe. Equivocal  low-density at the right putamen. Question small area of gray-white differentiation loss in the high and parasagittal left frontal lobe. No acute hemorrhage, hydrocephalus, or masslike finding. There is a cavum septum pellucidum et vergae Vascular: Hyperdense right MCA at the bifurcation and M2 level Skull: Negative Sinuses/Orbits: Gaze to the right Other: These results were communicated to Dr. Leonel Ramsay at 9:42 amon 4/13/2021by text page via the [facility] messaging system. CTA and perfusion is already pending. ASPECTS [facility] Stroke Program Early CT Score) - Ganglionic level infarction (caudate, lentiform nuclei, internal capsule, insula, M1-M3 cortex): 5/6 - Supraganglionic infarction (M4-M6 cortex): 2 Total score (0-10 with 10 being normal): 7/8 IMPRESSION: 1. Hyperdense right MCA bifurcation.  ASPECTS is 7 or 8. 2. Question small left ACA distribution cortex infarct. Electronically Signed   By: Monte Fantasia M.D.   On: 01/21/2020 09:45    Procedures Procedures (including critical care time)  Medications Ordered in ED Medications  sodium chloride flush (NS) 0.9 % injection 3 mL (has no administration in time range)  aspirin chewable tablet 324 mg (has no administration in time range)  LORazepam (ATIVAN) injection (1 mg Intravenous Given 01/21/20 1018)  iohexol (OMNIPAQUE) 350 MG/ML injection 100 mL (100 mLs Intravenous Contrast Given 01/21/20 0938)  LORazepam (ATIVAN) 2 MG/ML injection (0.5 mg  Given 01/21/20 1013)    ED Course  I have reviewed the triage vital signs and the nursing notes.  Pertinent labs & imaging results that were available during my care of the patient were reviewed by me and considered in my medical decision making (see chart for details).    MDM Rules/Calculators/A&P                      34 yo F with a cc of left-sided weakness.  Patient found to have an acute stroke.  Will be taken emergently to IR.  CRITICAL CARE Performed by: Cecilio Asper   Total  critical care time: 35 minutes  Critical care time was exclusive of separately billable procedures and treating other patients.  Critical care was necessary to treat or prevent imminent or life-threatening deterioration.  Critical care was time spent personally by me on the following activities: development of treatment plan with patient and/or surrogate as well as nursing, discussions with consultants, evaluation of patient's response to treatment, examination of patient, obtaining history from patient or surrogate, ordering and performing treatments and interventions, ordering and review of laboratory studies, ordering and review of radiographic studies, pulse oximetry and re-evaluation of patient's condition.  The patients results and plan were reviewed and discussed.   Any x-rays performed were independently reviewed by myself.   Differential diagnosis were considered with the presenting HPI.  Medications  sodium chloride flush (NS) 0.9 % injection 3 mL (has no administration in time range)  aspirin chewable tablet 324 mg (has no administration in time range)  LORazepam (ATIVAN) injection (1 mg Intravenous Given 01/21/20 1018)  iohexol (OMNIPAQUE) 350 MG/ML injection 100 mL (100 mLs Intravenous Contrast Given 01/21/20 0938)  LORazepam (ATIVAN) 2 MG/ML injection (0.5 mg  Given 01/21/20 1013)    Vitals:   01/21/20 0900 01/21/20 0930 01/21/20 1000  BP:   136/70  Pulse:   (!) 114  Resp:   (!) 25  SpO2:  96% 100%  Height: 5' 8.5" (1.74 m)      Final diagnoses:  Stroke [facility])    Admission/ observation were discussed with the admitting physician, patient and/or family and they are comfortable with the plan.   Final Clinical Impression(s) / ED Diagnoses Final diagnoses:  Stroke [facility])    Rx / DC Orders ED Discharge Orders    None       Deno Etienne, DO 01/21/20 1040

## 2020-01-21 NOTE — H&P (Signed)
Neurology H&P  Reason for Consult: Code stroke Referring Physician: Deno Etienne  CC: Right gaze, left-sided weakness, left facial droop  History is obtained from: EMS/patient  HPI: Glenda Hines is a 34 y.o. female with no significant past medical history for stroke risk factors.  Patient states that she went to bed at approximately 2200 hrs. and was normal at that time.  She awoke at 0800 hrs. and noted she did not feel right.  She called to her husband who noticed the facial droop and right gaze.  She attempted to get up but was unable to stand.  EMS was immediately called.  Patient was brought as a code stroke.  Upon patient's arrival it was noted that patient continued to have right-sided gaze preference, left-sided neglect and left facial droop.  Patient was immediately brought to CT to obtain CT of brain, CTA of head and neck along with perfusion.  Unfortunately she was a wake-up stroke and was not capable to receive TPA.  Patient states that she does not have any family members with any clotting deficiencies that she knows of.  She denies any falls, neck pain.  She is fully functional at home prior to this.  Patient states that she was on birth control however recently stopped.  LKW: 2200 hrs. on 01/20/2020 tpa given?: no, out of window, wake-up stroke Premorbid modified Rankin scale (mRS): 0 NIH stroke score of 10  Past Medical History:  Diagnosis Date  . Pituitary tumor   . SVT (supraventricular tachycardia) (HCC)     Family History  Problem Relation Age of Onset  . Hypertension Mother   . Diabetes Father    Social History:   reports that she does not drink alcohol or use drugs. No history on file for tobacco.  Medications  Current Facility-Administered Medications:  .  aspirin chewable tablet 324 mg, 324 mg, Oral, STAT, Joylene Wescott, Vida Roller, MD .  LORazepam (ATIVAN) injection, , , PRN, Greta Doom, MD, 1 mg at 01/21/20 1018 .  sodium chloride flush (NS)  0.9 % injection 3 mL, 3 mL, Intravenous, Once, Deno Etienne, DO No current outpatient medications on file.  ROS:    General ROS: negative for - chills, fatigue, fever, night sweats, weight gain or weight loss Psychological ROS: negative for - behavioral disorder, hallucinations, memory difficulties, mood swings or suicidal ideation Ophthalmic ROS: negative for - blurry vision, double vision, eye pain or loss of vision ENT ROS: negative for - epistaxis, nasal discharge, oral lesions, sore throat, tinnitus or vertigo Allergy and Immunology ROS: negative for - hives or itchy/watery eyes Hematological and Lymphatic ROS: negative for - bleeding problems, bruising or swollen lymph nodes Respiratory ROS: negative for - cough, hemoptysis, shortness of breath or wheezing Cardiovascular ROS: negative for - chest pain, dyspnea on exertion, edema or irregular heartbeat Gastrointestinal ROS: negative for - abdominal pain, diarrhea, hematemesis, nausea/vomiting or stool incontinence Genito-Urinary ROS: negative for - dysuria, hematuria, incontinence or urinary frequency/urgency Musculoskeletal ROS: negative for - joint swelling or muscular weakness Neurological ROS: as noted in HPI Dermatological ROS: negative for rash and skin lesion changes  Exam: Current vital signs: Ht 5' 8.5" (1.74 m)   BMI 23.28 kg/m  Vital signs in last 24 hours:   Constitutional: Appears well-developed and well-nourished.  Psych: Affect appropriate to situation Eyes: No scleral injection HENT: No OP obstrucion Head: Normocephalic.  Cardiovascular: Normal rate and regular rhythm.  Respiratory: Effort normal, non-labored breathing GI: Soft.  No distension. There is no  tenderness.  Skin: WDI  Neuro: Mental Status: Patient is awake, alert, oriented to person, place, month, year, and situation. Speech-dysarthric, no difficulties with naming, repeating, comprehension.  Able to give a good history. Cranial Nerves: II:  Left-sided neglect causing hemianopsia III,IV, VI: Right gaze preference. Pupils equal, round and reactive to light V: Facial sensation is symmetric to temperature VII: Left facial droop VIII: hearing is intact to voice X: Palat elevates symmetrically XI: Shoulder shrug is symmetric. XII: tongue is midline without atrophy or fasciculations.  Motor: Right upper and lower extremity 5/5.  Left upper extremity 3/5, left lower extremity 4/5. Drift Sensory: Sensation is symmetric to light touch and temperature in the arms and leg on the right Positive left-sided neglect Deep Tendon Reflexes: 2+ and symmetric in the biceps and patellae.  Plantars: Toes are downgoing bilaterally.  Cerebellar: FNF and HKS on the right  Labs I have reviewed labs in epic and the results pertinent to this consultation are:   CBC    Component Value Date/Time   WBC 5.0 01/21/2020 0930   RBC 4.36 01/21/2020 0930   HGB 12.2 01/21/2020 0938   HCT 36.0 01/21/2020 0938   PLT 273 01/21/2020 0930   MCV 88.3 01/21/2020 0930   MCH 29.6 01/21/2020 0930   MCHC 33.5 01/21/2020 0930   RDW 12.4 01/21/2020 0930   LYMPHSABS 2.0 01/21/2020 0930   MONOABS 0.4 01/21/2020 0930   EOSABS 0.1 01/21/2020 0930   BASOSABS 0.0 01/21/2020 0930    CMP     Component Value Date/Time   NA 140 01/21/2020 0938   K 3.7 01/21/2020 0938   CL 105 01/21/2020 0938   CO2 23 01/21/2020 0930   GLUCOSE 148 (H) 01/21/2020 0938   BUN 11 01/21/2020 0938   CREATININE 0.70 01/21/2020 0938   CALCIUM 8.8 (L) 01/21/2020 0930   PROT 7.0 01/21/2020 0930   ALBUMIN 3.6 01/21/2020 0930   AST 17 01/21/2020 0930   ALT 15 01/21/2020 0930   ALKPHOS 38 01/21/2020 0930   BILITOT 0.7 01/21/2020 0930   GFRNONAA >60 01/21/2020 0930   GFRAA >60 01/21/2020 0930    Lipid Panel  No results found for: CHOL, TRIG, HDL, CHOLHDL, VLDL, LDLCALC, LDLDIRECT   Imaging I have reviewed the images obtained:  CT-scan of the brain-hyperdense right MCA  bifurcation.  Aspects is 7 or 8.  Question small left ACA distribution cortex infarct.  CTA head, neck, cerebral perfusion-no emergent large vessel occlusion but there is irregular luminal thrombus at the right M1 segment causing moderate stenosis.  Right M3 branch occlusion adjacent to acute infarct by noncontrast CT.  Probable right pericallosal embolism.  Underestimated core infarct by CT perfusion when compared with aspects.  There is 130 cc territory of delayed/ischemic blood flow mainly in the upper division right MCA territory and right ACA territory.  Small area of ischemic blood flow in the left frontal pole where there was suspected cortical infarct by noncontrast CT.  No stenosis or embolic source seen in the neck.  MRI brain without contrast-  Etta Quill PA-C [facility] [phone]  M-F  (9:00 am- 5:00 PM)  01/21/2020, 10:21 AM    Attending note:  I have seen the patient and reviewed the above note. 34 year old right-handed female who woke up with deficits consistent with a M1 occlusion.  No history of clotting disorders or other predisposing factors other than birth control.  She does, however have a history of miscarriages and was worked up with hypercoagulable panel at  that time which was negative.  She does not take any medications other than multivitamin, as she stopped birth control on Saturday.  On exam, she initially had a dense left hemiparesis, right-sided gaze, left-sided visual and sensory neglect.  After her CTA, she did improve and was able to hold her arm and leg aloft with only mild drift.  She was also able to cross midline fairly readily.  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  There was a CT change, and therefore the CT perfusion was deemed unreliable due to pseudonormalization, and therefore she was taken  to MRI emergently.  The diffusion change on MRI appeared to be less than what was ischemic by CT perfusion and the diffusion/FLAIR mismatch was significant and therefore the decision was made to proceed with thrombectomy.  Recommendations: -To IR for embolectomy - HgbA1c, fasting lipid panel - MRI of the brain without contrast after the procedure - Frequent neuro checks - Prophylactic therapy-Antiplatelet med: Aspirin - dose 325mg  PO or 300mg  PR - Risk factor modification - Telemetry monitoring - PT consult, OT consult, Speech consult - Stroke team to follow -If no obvious source is identified, likely will need repeat hypercoagulable work-up, TEE and loop  This patient is critically ill and at significant risk of neurological worsening, death and care requires constant monitoring of vital signs, hemodynamics,respiratory and cardiac monitoring, neurological assessment, discussion with family, other specialists and medical decision making of high complexity. I spent 60 minutes of neurocritical care time  in the care of  this patient. This was time spent independent of any time provided by nurse practitioner or PA.  Roland Rack, MD [facility] [phone]  If 7pm- 7am, please page neurology on call as listed in [facility]. 01/21/2020  12:14 PM

## 2020-01-21 NOTE — Transfer of Care (Signed)
Immediate Anesthesia Transfer of Care Note  Patient: Glenda Hines  Procedure(s) Performed: RADIOLOGY WITH ANESTHESIA (N/A )  Patient Location: PACU  Anesthesia Type:General  Level of Consciousness: awake, patient cooperative and responds to stimulation  Airway & Oxygen Therapy: Patient Spontanous Breathing and Patient connected to face mask oxygen  Post-op Assessment: Report given to RN, Post -op Vital signs reviewed and stable and L) facial droop, PERRLA, + movement L)LE. MDA made aware.  Post vital signs: Reviewed and stable  Last Vitals:  Vitals Value Taken Time  BP 127/69 01/21/20 1354  Temp    Pulse 121 01/21/20 1355  Resp 27 01/21/20 1355  SpO2 98 % 01/21/20 1355  Vitals shown include unvalidated device data.  Last Pain: There were no vitals filed for this visit.       Complications: No apparent anesthesia complications

## 2020-01-21 NOTE — Code Documentation (Signed)
Code Stroke Documentation  33 yo female coming from home with past medical hx of SVT w/ ablation and pituitary tumor with complaints of right sided gaze and left sided weakness that was present upon waking. [facility] called and activated a Code Stroke.   Stroke Team met patient upon arrival to the ED. EDP cleared airway, labs drawn, and patient taken to CT. CT Head completed. No hemorrhage noted - Hyperdense right MCA bifurcation.  ASPECTS is 7 or 8. CTA/CTP completed. No core noted on CTP. Due to potential pseudo normalization patient taken to MRI.   MRI Completed showing "Cortical/subcortical restricted diffusion consistent with acute ischemic changes involving portions of the right frontal lobe and right temporoparietal junction within the right MCA vascular territory. There is only minimal patchy corresponding FLAIR hyperintensity on the current exam."   Decision made to go to IR at 1035. Family called and consented at 1036. Patient arrived in IR Bay 8 at 1038. Anesthesia Intubated and transferred to IR Suite at 1105.   Not a tPA candidate due to outside window.  LKW: 2200       

## 2020-01-21 NOTE — ED Notes (Signed)
All belongings and jewelry placed in belonging bag and given to IR staff

## 2020-01-21 NOTE — Anesthesia Procedure Notes (Signed)
Procedure Name: Intubation Date/Time: 01/21/2020 11:00 AM Performed by: Verdie Drown, CRNA Pre-anesthesia Checklist: Patient identified, Emergency Drugs available, Suction available, Patient being monitored and Timeout performed Patient Re-evaluated:Patient Re-evaluated prior to induction Oxygen Delivery Method: Ambu bag Preoxygenation: Pre-oxygenation with 100% oxygen Induction Type: IV induction, Rapid sequence and Cricoid Pressure applied Ventilation: Mask ventilation without difficulty Laryngoscope Size: Glidescope and 3 Grade View: Grade I Tube type: Oral Tube size: 7.5 mm Number of attempts: 2 Airway Equipment and Method: Stylet and Video-laryngoscopy Placement Confirmation: ETT inserted through vocal cords under direct vision,  breath sounds checked- equal and bilateral and CO2 detector Secured at: 23 cm Tube secured with: Tape Dental Injury: Teeth and Oropharynx as per pre-operative assessment  Difficulty Due To: Difficult Airway- due to limited oral opening and Difficult Airway- due to anterior larynx Comments: Induction in IR intubating bay. Smooth IV induction, G2 view with MAC3-unable to pass ETT through VC. Easy mask ventilation with ambubag. DL with Glidescope MAC3 for G1 view, atraumatic intubation.

## 2020-01-21 NOTE — Anesthesia Preprocedure Evaluation (Signed)
Anesthesia Evaluation  Patient identified by MRN, date of birth, ID band Patient awake    Reviewed: Allergy & Precautions, NPO status , Patient's Chart, lab work & pertinent test results  Airway Mallampati: II  TM Distance: >3 FB Neck ROM: Full    Dental no notable dental hx.    Pulmonary neg pulmonary ROS,    Pulmonary exam normal breath sounds clear to auscultation       Cardiovascular negative cardio ROS Normal cardiovascular exam Rhythm:Regular Rate:Normal     Neuro/Psych CVA, Residual Symptoms negative psych ROS   GI/Hepatic negative GI ROS, Neg liver ROS,   Endo/Other  negative endocrine ROS  Renal/GU negative Renal ROS  negative genitourinary   Musculoskeletal negative musculoskeletal ROS (+)   Abdominal (+) + obese,   Peds negative pediatric ROS (+)  Hematology negative hematology ROS (+)   Anesthesia Other Findings Acute CVA  Reproductive/Obstetrics negative OB ROS                             Anesthesia Physical Anesthesia Plan  ASA: IV and emergent  Anesthesia Plan: General   Post-op Pain Management:    Induction: Intravenous, Rapid sequence and Cricoid pressure planned  PONV Risk Score and Plan: 3 and Ondansetron, Dexamethasone, Midazolam and Treatment may vary due to age or medical condition  Airway Management Planned: Oral ETT  Additional Equipment:   Intra-op Plan:   Post-operative Plan: Extubation in OR and Possible Post-op intubation/ventilation  Informed Consent: I have reviewed the patients History and Physical, chart, labs and discussed the procedure including the risks, benefits and alternatives for the proposed anesthesia with the patient or authorized representative who has indicated his/her understanding and acceptance.     Dental advisory given  Plan Discussed with: CRNA  Anesthesia Plan Comments:         Anesthesia Quick Evaluation

## 2020-01-22 ENCOUNTER — Inpatient Hospital Stay (HOSPITAL_COMMUNITY): Payer: BC Managed Care – PPO

## 2020-01-22 DIAGNOSIS — I639 Cerebral infarction, unspecified: Secondary | ICD-10-CM | POA: Diagnosis not present

## 2020-01-22 DIAGNOSIS — I340 Nonrheumatic mitral (valve) insufficiency: Secondary | ICD-10-CM | POA: Diagnosis not present

## 2020-01-22 DIAGNOSIS — I63411 Cerebral infarction due to embolism of right middle cerebral artery: Secondary | ICD-10-CM | POA: Diagnosis not present

## 2020-01-22 DIAGNOSIS — Z9889 Other specified postprocedural states: Secondary | ICD-10-CM | POA: Diagnosis not present

## 2020-01-22 DIAGNOSIS — I63511 Cerebral infarction due to unspecified occlusion or stenosis of right middle cerebral artery: Secondary | ICD-10-CM | POA: Diagnosis not present

## 2020-01-22 DIAGNOSIS — Q211 Atrial septal defect: Secondary | ICD-10-CM | POA: Diagnosis not present

## 2020-01-22 DIAGNOSIS — R9431 Abnormal electrocardiogram [ECG] [EKG]: Secondary | ICD-10-CM | POA: Diagnosis not present

## 2020-01-22 LAB — SEDIMENTATION RATE: Sed Rate: 10 mm/hr (ref 0–22)

## 2020-01-22 LAB — RAPID URINE DRUG SCREEN, HOSP PERFORMED
Amphetamines: NOT DETECTED
Barbiturates: NOT DETECTED
Benzodiazepines: NOT DETECTED
Cocaine: NOT DETECTED
Opiates: NOT DETECTED
Tetrahydrocannabinol: NOT DETECTED

## 2020-01-22 LAB — LIPID PANEL
Cholesterol: 245 mg/dL — ABNORMAL HIGH (ref 0–200)
HDL: 62 mg/dL (ref >40)
LDL Cholesterol: 164 mg/dL — ABNORMAL HIGH (ref 0–99)
Total CHOL/HDL Ratio: 4 RATIO
Triglycerides: 97 mg/dL (ref <150)
VLDL: 19 mg/dL (ref 0–40)

## 2020-01-22 LAB — HIV ANTIBODY (ROUTINE TESTING W REFLEX): HIV Screen 4th Generation wRfx: NONREACTIVE

## 2020-01-22 LAB — ANTITHROMBIN III: AntiThromb III Func: 126 % — ABNORMAL HIGH (ref 75–120)

## 2020-01-22 MED ORDER — CLOPIDOGREL BISULFATE 75 MG PO TABS
75.0000 mg | ORAL_TABLET | Freq: Every day | ORAL | Status: DC
  Administered 2020-01-22 – 2020-01-23 (×2): 75 mg via ORAL
  Filled 2020-01-22 (×2): qty 1

## 2020-01-22 MED ORDER — ASPIRIN EC 81 MG PO TBEC
81.0000 mg | DELAYED_RELEASE_TABLET | Freq: Every day | ORAL | Status: DC
  Administered 2020-01-22 – 2020-01-23 (×2): 81 mg via ORAL
  Filled 2020-01-22 (×2): qty 1

## 2020-01-22 MED ORDER — ATORVASTATIN CALCIUM 40 MG PO TABS
40.0000 mg | ORAL_TABLET | Freq: Every day | ORAL | Status: DC
  Administered 2020-01-22 – 2020-01-23 (×2): 40 mg via ORAL
  Filled 2020-01-22 (×2): qty 1

## 2020-01-22 MED ORDER — ENOXAPARIN SODIUM 40 MG/0.4ML ~~LOC~~ SOLN
40.0000 mg | SUBCUTANEOUS | Status: DC
  Administered 2020-01-22 – 2020-01-23 (×2): 40 mg via SUBCUTANEOUS
  Filled 2020-01-22 (×2): qty 0.4

## 2020-01-22 MED ORDER — SODIUM CHLORIDE 0.9 % IV SOLN: INTRAVENOUS | Status: DC

## 2020-01-22 NOTE — H&P (View-Only) (Signed)
STROKE TEAM PROGRESS NOTE   INTERVAL HISTORY Her husband is at the bedside. Although pt has no significant PMH, reviewed family history and further questions about clotting/hem disorders. 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 She was on BCPs for 6 weeks, just stopped these few days ago. She does note having new palpitations recently Further wk up discussed with pt/husband.  She also history of migraines with vision disturbances with last episode a month ago.  She did have a headache the night before her stroke but states this was different from her typical migraines.  She denies drinking excessive high protein energy drinks, caffeine or soda.  Vitals:   01/22/20 0700 01/22/20 0800 01/22/20 0900 01/22/20 1000  BP: (!) 177/77 113/76 123/86 114/62  Pulse:  70 84   Resp: 18 16 (!) 22 (!) 24  Temp:  98.8 F (37.1 C)    TempSrc:  Oral    SpO2:  98%  99%  Weight:      Height:        CBC:  Recent Labs  Lab 01/21/20 0930 01/21/20 0938  WBC 5.0  --   NEUTROABS 2.6  --   HGB 12.9 12.2  HCT 38.5 36.0  MCV 88.3  --   PLT 273  --     Basic Metabolic Panel:  Recent Labs  Lab 01/21/20 0930 01/21/20 0938  NA 138 140  K 3.6 3.7  CL 105 105  CO2 23  --   GLUCOSE 154* 148*  BUN 10 11  CREATININE 0.84 0.70  CALCIUM 8.8*  --    Lipid Panel:     Component Value Date/Time   CHOL 245 (H) 01/22/2020 0438   TRIG 97 01/22/2020 0438   HDL 62 01/22/2020 0438   CHOLHDL 4.0 01/22/2020 0438   VLDL 19 01/22/2020 0438   LDLCALC 164 (H) 01/22/2020 0438   HgbA1c: No results found for: HGBA1C Urine Drug Screen: No results found for: LABOPIA, COCAINSCRNUR, LABBENZ, AMPHETMU, THCU, LABBARB  Alcohol Level No results found for: ETH  IMAGING past 24 hours MR BRAIN WO CONTRAST  Result Date: 01/21/2020 CLINICAL DATA:  Right-sided gaze with left-sided weakness EXAM: MRI HEAD WITHOUT CONTRAST TECHNIQUE: Multiplanar, multiecho pulse sequences of the brain and surrounding structures were  obtained without intravenous contrast. COMPARISON:  Head CT 01/21/2020 FINDINGS: Brain: Anterior right MCA territory acute cortical infarct predominantly affecting the posterior right frontal cortex and operculum. There is also acute ischemia the right basal ganglia. Multiple smaller foci of ischemia in the posterior right hemisphere, still within the MCA territory. No acute ischemia within any other vascular territory. Normal white matter signal. Normal volume of CSF spaces. Multiple punctate foci of magnetic susceptibility effect within the right hemisphere, likely indicating petechial hemorrhage. Cavum septum pellucidum et vergae. No midline shift, ventricular entrapment or other mass effect. Vascular: Normal flow voids. Skull and upper cervical spine: Normal marrow signal. Sinuses/Orbits: Negative. Other: None. IMPRESSION: 1. Anterior right MCA territory acute infarct predominantly affecting the posterior and opercular cortices of the frontal lobe. Multiple smaller foci of ischemia in the posterior right hemisphere, still within the MCA territory. 2. Multiple punctate foci of magnetic susceptibility effect within the right hemisphere, likely indicating petechial hemorrhage. Electronically Signed   By: Ulyses Jarred M.D.   On: 01/21/2020 22:35   MR BRAIN WO CONTRAST  Result Date: 01/21/2020 CLINICAL DATA:  Stroke, follow-up. Additional history provided: 34 year old female with left-sided weakness. EXAM: MRI HEAD WITHOUT CONTRAST TECHNIQUE: Multiplanar, multiecho pulse sequences  of the brain and surrounding structures were obtained without intravenous contrast. COMPARISON:  Noncontrast head CT, CT angiogram head/neck and CT perfusion performed earlier the same day 01/21/2020 FINDINGS: Brain: A limited protocol brain MRI was performed at the ordering provider's request. Only axial and coronal diffusion-weighted imaging and an axial T2 FLAIR sequence were obtained. There is cortical/subcortical restricted  diffusion involving portions of the posterolateral right frontal lobe and right frontal operculum as well as portions of the right temporoparietal junction within the MCA vascular territory. Findings are consistent with acute ischemic changes. There is only minimal patchy corresponding FLAIR hyperintensity on the current exam. There is no significant mass effect. No midline shift. No restricted diffusion is demonstrated at site of a previously questioned acute infarction within the anterior left frontal lobe. Vascular: Poorly assessed on the acquired sequences. Skull and upper cervical spine: No focal marrow lesion identified on the acquired sequences. Sinuses/Orbits: Unremarkable appearance of the orbits on the acquired sequences. Minimal ethmoid sinus mucosal thickening. No appreciable mastoid effusion. IMPRESSION: Cortical/subcortical restricted diffusion consistent with acute ischemic changes involving portions of the right frontal lobe and right temporoparietal junction within the right MCA vascular territory. There is only minimal patchy corresponding FLAIR hyperintensity on the current exam. No restricted diffusion is demonstrated at the site of a previously questioned acute anterior left frontal lobe infarct. Electronically Signed   By: Kellie Simmering DO   On: 01/21/2020 12:04   ECHOCARDIOGRAM COMPLETE  Result Date: 01/21/2020    ECHOCARDIOGRAM REPORT   Patient Name:   Glenda Hines Date of Exam: 01/21/2020 Medical Rec #:  [id number]       Height:       67.5 in Accession #:    [id number]      Weight:       185.0 lb Date of Birth:  [birth year]       BSA:          1.967 m Patient Age:    33 years        BP:           124/74 mmHg Patient Gender: F               HR:           103 bpm. Exam Location:  Inpatient Procedure: 2D Echo, Cardiac Doppler and Color Doppler Indications:    Stroke 434.91/I163.9  History:        Patient has no prior history of Echocardiogram examinations. H/o                 SVT.   Sonographer:    Clayton Lefort RDCS (AE) Referring Phys: [phone] MCNEILL P [place]  1. Left ventricular ejection fraction, by estimation, is 60 to 65%. The left ventricle has normal function. The left ventricle has no regional wall motion abnormalities. Left ventricular diastolic parameters were normal.  2. Right ventricular systolic function is normal. The right ventricular size is normal.  3. The mitral valve is normal in structure. Trivial mitral valve regurgitation. No evidence of mitral stenosis.  4. The aortic valve is normal in structure. Aortic valve regurgitation is not visualized. No aortic stenosis is present.  5. The inferior vena cava is normal in size with greater than 50% respiratory variability, suggesting right atrial pressure of 3 mmHg. FINDINGS  Left Ventricle: Left ventricular ejection fraction, by estimation, is 60 to 65%. The left ventricle has normal function. The left ventricle has no regional wall motion abnormalities. The left ventricular  internal cavity size was normal in size. There is  no left ventricular hypertrophy. Left ventricular diastolic parameters were normal. Normal left ventricular filling pressure. Right Ventricle: The right ventricular size is normal. No increase in right ventricular wall thickness. Right ventricular systolic function is normal. Left Atrium: Left atrial size was normal in size. Right Atrium: Right atrial size was normal in size. Pericardium: There is no evidence of pericardial effusion. Mitral Valve: The mitral valve is normal in structure. Normal mobility of the mitral valve leaflets. Trivial mitral valve regurgitation. No evidence of mitral valve stenosis. Tricuspid Valve: The tricuspid valve is normal in structure. Tricuspid valve regurgitation is not demonstrated. No evidence of tricuspid stenosis. Aortic Valve: The aortic valve is normal in structure. Aortic valve regurgitation is not visualized. No aortic stenosis is present. Aortic valve mean  gradient measures 5.0 mmHg. Aortic valve peak gradient measures 9.2 mmHg. Aortic valve area, by VTI measures 2.82 cm. Pulmonic Valve: The pulmonic valve was normal in structure. Pulmonic valve regurgitation is not visualized. No evidence of pulmonic stenosis. Aorta: The aortic root is normal in size and structure. Venous: The inferior vena cava is normal in size with greater than 50% respiratory variability, suggesting right atrial pressure of 3 mmHg. IAS/Shunts: No atrial level shunt detected by color flow Doppler.  LEFT VENTRICLE PLAX 2D LVIDd:         4.56 cm LVIDs:         3.58 cm LV PW:         0.99 cm LV IVS:        1.05 cm LVOT diam:     2.20 cm LV SV:         79 LV SV Index:   40 LVOT Area:     3.80 cm  RIGHT VENTRICLE             IVC RV Basal diam:  2.43 cm     IVC diam: 1.06 cm RV S prime:     15.20 cm/s TAPSE (M-mode): 2.3 cm LEFT ATRIUM             Index       RIGHT ATRIUM           Index LA diam:        3.60 cm 1.83 cm/m  RA Area:     12.50 cm LA Vol (A2C):   31.9 ml 16.22 ml/m RA Volume:   27.30 ml  13.88 ml/m LA Vol (A4C):   25.2 ml 12.81 ml/m LA Biplane Vol: 29.8 ml 15.15 ml/m  AORTIC VALVE AV Area (Vmax):    2.85 cm AV Area (Vmean):   2.49 cm AV Area (VTI):     2.82 cm AV Vmax:           152.00 cm/s AV Vmean:          111.000 cm/s AV VTI:            0.280 m AV Peak Grad:      9.2 mmHg AV Mean Grad:      5.0 mmHg LVOT Vmax:         114.00 cm/s LVOT Vmean:        72.800 cm/s LVOT VTI:          0.208 m LVOT/AV VTI ratio: 0.74  AORTA Ao Root diam: 3.10 cm Ao Asc diam:  2.80 cm  SHUNTS Systemic VTI:  0.21 m Systemic Diam: 2.20 cm Dani Gobble Croitoru MD Electronically signed by Sanda Klein  MD Signature Date/Time: 01/21/2020/4:23:51 PM    Final     PHYSICAL EXAM Constitutional: Appears well-developed and well-nourished.  Young Caucasian lady Psych: Affect appropriate to situation Eyes: No scleral injection HENT: No OP obstrucion Head: Normocephalic.  Cardiovascular: Normal rate and regular  rhythm.  Respiratory: Effort normal, non-labored breathing GI: Soft. No distension. There is no tenderness.  Skin: WDI  Neuro: Mental Status: Patient is awake, alert, oriented to person, place, month, year, and situation. Speech is slightly dysarthric, no difficulties with naming, repeating, comprehension.  Able to give a good history. Cranial Nerves: II: partial hemianopsia III,IV, VI: Right gaze preference, but able to cross midline. Pupils equal, round and reactive to light V: Facial sensation is symmetric to temperature VII: Left facial droop VIII: hearing is intact to voice X: Palat elevates symmetrically XI: Shoulder shrug is symmetric. XII: tongue is midline without atrophy or fasciculations.  Motor: Right upper and lower extremity 5/5.  Left upper extremity 5/5, with mild drift only.  left lower extremity 5/5, w/o drift.  Mild weakness of left grip and intrinsic hand muscles.  Orbits right over left upper extremity.  Fine finger movements and foot tapping are diminished on the left compared to the right. Drift Sensory: Sensation is symmetric to light touch and temperature in the arms and leg on the right Positive left-sided neglect but mild Deep Tendon Reflexes: 2+ and symmetric in the biceps and patellae.  Plantars: Toes are downgoing bilaterally.  Cerebellar: FNF and HKS on the right  NIHSS score 4. ASSESSMENT/PLAN [name] is a 34 year old female with no previous PMH or stroke risks. Embolic infarct, unclear etiology.  There was a CT change, and therefore the CT perfusion was deemed unreliable due to pseudonormalization, and therefore she was taken to MRI emergently.  The diffusion change on MRI appeared to be less than what was ischemic by CT perfusion and the diffusion/FLAIR mismatch was significant and therefore the decision was made to proceed with thrombectomy.  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  Hyperdense RMCA at bifurcation  CTA head & neck: R M1 thrombus, R M3 occlusion. No ICA stenosis  CT perfusion deemed unreliable; pseudonormalization noted    MRI : there was diffusion change on initial MRI; f/u MRI showed diffuse RMCA infarcts, tiny petechial hemorrhage noted.  2D Echo: 60% EF, no shunt or PFO noted. Will check TCD w/bubble and TEE as well as LE [place].   LDL 164  HgbA1c No results found for requested labs within last 26280 hours.  Lovenox for VTE prophylaxis    Diet   Diet Heart Room service appropriate? Yes; Fluid consistency: Thin     none prior to admission, now on ASA + Plavix for 3 weeks  Therapy recommendations: DAPT x3wk, then monotherapy with ASA only (unless need for [facility] is found)  Disposition:  tbd  NO known Hypertension . Long-term BP goal normotensive  Hyperlipidemia  Home meds: none  LDL 164, goal < 70  Add Lipitor 40mg  PO QHS  Continue statin at discharge   No known Diabetes Recent Labs    01/21/20 0928  GLUCAP 126*     Other Stroke Risk Factors  none  Hospital day # 1 Ok to transfer out of ICU to neuro floor today. Further wk up for young pt with emboic appearing stroke was d/w pt and husband. We will check TCD w/bubble and likely need TEE as well, LE [place] for dvts and hypercoag wk up pending.  Glenda Hines, ARNP-C, ANVP-BC Pager: [phone]  I have personally obtained history,examined this patient, reviewed notes, independently viewed imaging studies, participated in medical decision making and plan of care.ROS completed by me personally and pertinent positives fully documented  I have made any additions or clarifications directly to the above note. Agree with note above.  She has presented with embolic right MCA infarct and underwent mechanical thrombectomy with partial recanalization.  MRI scan shows patchy right MCA infarct but patient seems to be clinically improving.  Recommend aspirin and Plavix for stroke  prevention for 3 weeks followed by aspirin alone.  Check TEE for cardiac source of embolism and TCD bubble study for PFO.  Check hypercoagulable panel labs, vasculitic labs, lower extremity venous Dopplers for DVT.  Mobilize out of bed.  Therapy consults.  Long discussion with patient, husband and mother at the bedside and answered questions.  Discussed with Dr. Norma Fredrickson.  Continue close neurological monitoring and strict blood pressure control as per post intervention protocol This patient is critically ill and at significant risk of neurological worsening, death and care requires constant monitoring of vital signs, hemodynamics,respiratory and cardiac monitoring, extensive review of multiple databases, frequent neurological assessment, discussion with family, other specialists and medical decision making of high complexity.I have made any additions or clarifications directly to the above note.This critical care time does not reflect procedure time, or teaching time or supervisory time of PA/NP/Med Resident etc but could involve care discussion time.  I spent 35 minutes of neurocritical care time  in the care of  this patient.     Antony Contras, MD Medical Director [facility] Pager: [phone] 01/22/2020 2:00 PM  To contact Stroke Continuity provider, please refer to [email or web address] After hours, contact General Neurology

## 2020-01-22 NOTE — Evaluation (Signed)
Speech Language Pathology Evaluation Patient Details Name: Glenda Hines MRN: [id number] DOB: 1986/03/10 Today's Date: 01/22/2020 Time: [id number] SLP Time Calculation (min) (ACUTE ONLY): 28 min  Problem List:  Patient Active Problem List   Diagnosis Date Noted  . Stroke (cerebrum) ([place]) 01/21/2020   Past Medical History:  Past Medical History:  Diagnosis Date  . Pituitary tumor   . SVT (supraventricular tachycardia) ([facility])    Past Surgical History:  Past Surgical History:  Procedure Laterality Date  . catheter ablation    . IR CT HEAD LTD  01/21/2020  . IR PERCUTANEOUS ART THROMBECTOMY/INFUSION INTRACRANIAL INC DIAG ANGIO  01/21/2020  . KNEE SURGERY    . RADIOLOGY WITH ANESTHESIA N/A 01/21/2020   Procedure: RADIOLOGY WITH ANESTHESIA;  Surgeon: Radiologist, Medication, MD;  Location: [place];  Service: Radiology;  Laterality: N/A;  . TONSILLECTOMY     HPI:  34 y.o. female admitted on 01/21/20 for L sided weakness/facial droop.  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  Pt with significant PMH of SVT, pituitary tumor, multiple R knee surgeries.    Assessment / Plan / Recommendation Clinical Impression   Pt presents with the following mild cognitive linguistic impairments: sequencing and calculating. Per the pt and family report, she would have preformed similarly on the assessment PTA. Pt's processing does seem a bit slow, and per PT/OT she was noted with decreased awareness of L attention. Pt's voice was also noted to be very soft and breathy.The pt reported her throat is sore from the brief intubation, which is why she is talking so softly. SLP provided edu re: the importance of utilizing cognitive stimulation activities (crossword puzzles, soduku , etc) while in the hospital. 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 SLP provided the  option and recommendation for outpatient services, if the pt were to have difficulties with higher level functioning once she returns to her home environment and routine. Pt and family were in agreement with all recommendations, and would like to proceed with outpatient SLP services. No acute f/u needed at this time. Will s/o.    SLP Assessment  SLP Recommendation/Assessment: All further Speech Lanaguage Pathology  needs can be addressed in the next venue of care SLP Visit Diagnosis: Cognitive communication deficit (R41.841)    Follow Up Recommendations  Outpatient SLP    Frequency and Duration           SLP Evaluation Cognition  Overall Cognitive Status: Impaired/Different from baseline Arousal/Alertness: Awake/alert Orientation Level: Oriented X4 Attention: Sustained Sustained Attention: Appears intact Memory: Appears intact Awareness: Appears intact Problem Solving: Appears intact Executive Function: Sequencing Sequencing: Impaired Sequencing Impairment: Verbal complex Safety/Judgment: Appears intact       Comprehension  Auditory Comprehension Overall Auditory Comprehension: Appears within functional limits for tasks assessed Visual Recognition/Discrimination Discrimination: Within Function Limits    Expression Expression Primary Mode of Expression: Verbal Verbal Expression Overall Verbal Expression: Appears within functional limits for tasks assessed Written Expression Dominant Hand: Right   Oral / Motor  Oral Motor/Sensory Function Overall Oral Motor/Sensory Function: Mild impairment Facial ROM: Reduced left Facial Symmetry: Abnormal symmetry left Facial Strength: Reduced left Motor Speech Overall Motor Speech: Appears within functional limits for tasks assessed   [place], Student SLP Office: [phone]  01/22/2020, 3:22 PM

## 2020-01-22 NOTE — Evaluation (Signed)
Physical Therapy Evaluation Patient Details Name: Glenda Hines MRN: [id number] DOB: 12/25/1985 Today's Date: 01/22/2020   History of Present Illness  34 y.o. female admitted on 01/21/20 for L sided weakness/facial droop.  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  Pt with significant PMH of SVT, pituitary tumor, multiple R knee surgeries.    Clinical Impression  Pt was able to get up and walk around the unit with supervision only, gait speed is slow and she is very close to left sided obstacles due to a bit of left sided inattention.  She is planning to go stay with her parents for a bit for increased help with herself and her two young children.  I believe she could benefit from OP PT for high level balance and gait training to help her return to a level of mobility where she can keep up with her 2 and 34 y.o. boys.  PT to follow acutely for deficits listed below.    Follow Up Recommendations Outpatient PT    Equipment Recommendations  None recommended by PT    Recommendations for Other Services   NA    Precautions / Restrictions Precautions Precautions: Other (comment) Precaution Comments: some left inattention      Mobility  Bed Mobility               General bed mobility comments: Pt was OOB in the recliner chair.   Transfers Overall transfer level: Needs assistance Equipment used: None Transfers: Sit to/from Stand Sit to Stand: Supervision         General transfer comment: supervision for safety  Ambulation/Gait Ambulation/Gait assistance: Supervision Gait Distance (Feet): 200 Feet Assistive device: None Gait Pattern/deviations: Step-through pattern Gait velocity: decreased Gait velocity interpretation: 1.31 - 2.62 ft/sec, indicative of limited community ambulator General Gait Details: Very slow gait pattern (cautious), pt getting  very close to obstacles on her left side associated with the left inattention.       Modified Rankin (Stroke Patients Only) Modified Rankin (Stroke Patients Only) Pre-Morbid Rankin Score: No symptoms Modified Rankin: Moderately severe disability     Balance Overall balance assessment: Needs assistance Sitting-balance support: Feet supported;No upper extremity supported Sitting balance-Leahy Scale: Good     Standing balance support: No upper extremity supported Standing balance-Leahy Scale: Good                   Standardized Balance Assessment Standardized Balance Assessment : Dynamic Gait Index   Dynamic Gait Index Level Surface: Normal Change in Gait Speed: Normal Gait with Horizontal Head Turns: Mild Impairment Gait with Vertical Head Turns: Normal Gait and Pivot Turn: Normal Step Over Obstacle: Normal       Pertinent Vitals/Pain Pain Assessment: Faces Faces Pain Scale: Hurts little more Pain Location: headache Pain Descriptors / Indicators: Aching Pain Intervention(s): Limited activity within patient's tolerance;Monitored during session;Repositioned    Home Living Family/patient expects to be discharged to:: Private residence Living Arrangements: Spouse/significant other Available Help at Discharge: Family;Available 24 hours/day(mom is going to take days off) Type of Home: House Home Access: Stairs to enter Entrance Stairs-Rails: None Entrance Stairs-Number of Steps: 3 Home Layout: One level Home Equipment: None Additional Comments: going to stay with parents, above reflects their home    Prior Function Level of Independence: Independent         Comments: takes care of her 2 and 34 year  old boys full time     Hand Dominance   Dominant Hand: Right    Extremity/Trunk Assessment   Upper Extremity Assessment Upper Extremity Assessment: Defer to OT evaluation    Lower Extremity Assessment Lower Extremity Assessment: Overall WFL for tasks  assessed(5/5 per seated MMT, heel/shin test WNL)    Cervical / Trunk Assessment Cervical / Trunk Assessment: Normal  Communication   Communication: No difficulties  Cognition Arousal/Alertness: Awake/alert Behavior During Therapy: Flat affect(emotionally labile vs appropriately emotional?) Overall Cognitive Status: Impaired/Different from baseline Area of Impairment: Awareness;Problem solving                           Awareness: Emergent Problem Solving: Slow processing(seems just a shade slow to process) General Comments: Pt generally alert and oriented to her situation, may have some emotional lability as she goes from flat affect to sobbing to ok (although she also has reason to be upset, so more pattern needed to really assess hyper emotional state due to stroke), a bit slower to process than I would think for a normal 34 y.o. and decreased awareness of her left inattention.              Assessment/Plan    PT Assessment Patient needs continued PT services  PT Problem List Decreased strength;Decreased activity tolerance;Decreased balance;Decreased mobility;Decreased safety awareness;Decreased knowledge of precautions       PT Treatment Interventions Gait training;Stair training;Therapeutic activities;Functional mobility training;Therapeutic exercise;Balance training;Neuromuscular re-education;Cognitive remediation;Patient/family education    PT Goals (Current goals can be found in the Care Plan section)  Acute Rehab PT Goals Patient Stated Goal: to get back to normal so she can continue to care for her children as she did before.  PT Goal Formulation: With patient Time For Goal Achievement: 02/05/20 Potential to Achieve Goals: Good    Frequency Min 4X/week        Co-evaluation PT/OT/SLP Co-Evaluation/Treatment: Yes Reason for Co-Treatment: Complexity of the patient's impairments (multi-system involvement) PT goals addressed during session: Mobility/safety  with mobility;Balance         AM-PAC PT "6 Clicks" Mobility  Outcome Measure Help needed turning from your back to your side while in a flat bed without using bedrails?: None Help needed moving from lying on your back to sitting on the side of a flat bed without using bedrails?: None Help needed moving to and from a bed to a chair (including a wheelchair)?: None Help needed standing up from a chair using your arms (e.g., wheelchair or bedside chair)?: None Help needed to walk in hospital room?: None Help needed climbing 3-5 steps with a railing? : A Little 6 Click Score: 23    End of Session   Activity Tolerance: Patient tolerated treatment well Patient left: in chair;with call bell/phone within reach;with family/visitor present Nurse Communication: Mobility status PT Visit Diagnosis: Muscle weakness (generalized) (M62.81);Difficulty in walking, not elsewhere classified (R26.2);Hemiplegia and hemiparesis Hemiplegia - Right/Left: Left Hemiplegia - dominant/non-dominant: Non-dominant Hemiplegia - caused by: Cerebral infarction    Time: 1025-1101 PT Time Calculation (min) (ACUTE ONLY): 36 min   Charges:       Verdene Lennert, PT, DPT  Acute Rehabilitation [phone] pager #[phone] office     PT Evaluation $PT Eval Moderate Complexity: 1 Mod          01/22/2020, 1:54 PM

## 2020-01-22 NOTE — Progress Notes (Signed)
Pt c/o intermittent mild tingling/numbness of L fingers, Dr. Leonie Man notified.

## 2020-01-22 NOTE — Progress Notes (Addendum)
STROKE TEAM PROGRESS NOTE   INTERVAL HISTORY Her husband is at the bedside. Although pt has no significant PMH, reviewed family history and further questions about clotting/hem disorders. 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 She was on BCPs for 6 weeks, just stopped these few days ago. She does note having new palpitations recently Further wk up discussed with pt/husband.  She also history of migraines with vision disturbances with last episode a month ago.  She did have a headache the night before her stroke but states this was different from her typical migraines.  She denies drinking excessive high protein energy drinks, caffeine or soda.  Vitals:   01/22/20 0700 01/22/20 0800 01/22/20 0900 01/22/20 1000  BP: (!) 177/77 113/76 123/86 114/62  Pulse:  70 84   Resp: 18 16 (!) 22 (!) 24  Temp:  98.8 F (37.1 C)    TempSrc:  Oral    SpO2:  98%  99%  Weight:      Height:        CBC:  Recent Labs  Lab 01/21/20 0930 01/21/20 0938  WBC 5.0  --   NEUTROABS 2.6  --   HGB 12.9 12.2  HCT 38.5 36.0  MCV 88.3  --   PLT 273  --     Basic Metabolic Panel:  Recent Labs  Lab 01/21/20 0930 01/21/20 0938  NA 138 140  K 3.6 3.7  CL 105 105  CO2 23  --   GLUCOSE 154* 148*  BUN 10 11  CREATININE 0.84 0.70  CALCIUM 8.8*  --    Lipid Panel:     Component Value Date/Time   CHOL 245 (H) 01/22/2020 0438   TRIG 97 01/22/2020 0438   HDL 62 01/22/2020 0438   CHOLHDL 4.0 01/22/2020 0438   VLDL 19 01/22/2020 0438   LDLCALC 164 (H) 01/22/2020 0438   HgbA1c: No results found for: HGBA1C Urine Drug Screen: No results found for: LABOPIA, COCAINSCRNUR, LABBENZ, AMPHETMU, THCU, LABBARB  Alcohol Level No results found for: ETH  IMAGING past 24 hours MR BRAIN WO CONTRAST  Result Date: 01/21/2020 CLINICAL DATA:  Right-sided gaze with left-sided weakness EXAM: MRI HEAD WITHOUT CONTRAST TECHNIQUE: Multiplanar, multiecho pulse sequences of the brain and surrounding structures were  obtained without intravenous contrast. COMPARISON:  Head CT 01/21/2020 FINDINGS: Brain: Anterior right MCA territory acute cortical infarct predominantly affecting the posterior right frontal cortex and operculum. There is also acute ischemia the right basal ganglia. Multiple smaller foci of ischemia in the posterior right hemisphere, still within the MCA territory. No acute ischemia within any other vascular territory. Normal white matter signal. Normal volume of CSF spaces. Multiple punctate foci of magnetic susceptibility effect within the right hemisphere, likely indicating petechial hemorrhage. Cavum septum pellucidum et vergae. No midline shift, ventricular entrapment or other mass effect. Vascular: Normal flow voids. Skull and upper cervical spine: Normal marrow signal. Sinuses/Orbits: Negative. Other: None. IMPRESSION: 1. Anterior right MCA territory acute infarct predominantly affecting the posterior and opercular cortices of the frontal lobe. Multiple smaller foci of ischemia in the posterior right hemisphere, still within the MCA territory. 2. Multiple punctate foci of magnetic susceptibility effect within the right hemisphere, likely indicating petechial hemorrhage. Electronically Signed   By: Ulyses Jarred M.D.   On: 01/21/2020 22:35   MR BRAIN WO CONTRAST  Result Date: 01/21/2020 CLINICAL DATA:  Stroke, follow-up. Additional history provided: 34 year old female with left-sided weakness. EXAM: MRI HEAD WITHOUT CONTRAST TECHNIQUE: Multiplanar, multiecho pulse sequences  of the brain and surrounding structures were obtained without intravenous contrast. COMPARISON:  Noncontrast head CT, CT angiogram head/neck and CT perfusion performed earlier the same day 01/21/2020 FINDINGS: Brain: A limited protocol brain MRI was performed at the ordering provider's request. Only axial and coronal diffusion-weighted imaging and an axial T2 FLAIR sequence were obtained. There is cortical/subcortical restricted  diffusion involving portions of the posterolateral right frontal lobe and right frontal operculum as well as portions of the right temporoparietal junction within the MCA vascular territory. Findings are consistent with acute ischemic changes. There is only minimal patchy corresponding FLAIR hyperintensity on the current exam. There is no significant mass effect. No midline shift. No restricted diffusion is demonstrated at site of a previously questioned acute infarction within the anterior left frontal lobe. Vascular: Poorly assessed on the acquired sequences. Skull and upper cervical spine: No focal marrow lesion identified on the acquired sequences. Sinuses/Orbits: Unremarkable appearance of the orbits on the acquired sequences. Minimal ethmoid sinus mucosal thickening. No appreciable mastoid effusion. IMPRESSION: Cortical/subcortical restricted diffusion consistent with acute ischemic changes involving portions of the right frontal lobe and right temporoparietal junction within the right MCA vascular territory. There is only minimal patchy corresponding FLAIR hyperintensity on the current exam. No restricted diffusion is demonstrated at the site of a previously questioned acute anterior left frontal lobe infarct. Electronically Signed   By: Kellie Simmering DO   On: 01/21/2020 12:04   ECHOCARDIOGRAM COMPLETE  Result Date: 01/21/2020    ECHOCARDIOGRAM REPORT   Patient Name:   Glenda Hines Date of Exam: 01/21/2020 Medical Rec #:  [id number]       Height:       67.5 in Accession #:    [id number]      Weight:       185.0 lb Date of Birth:  1986/08/16       BSA:          1.967 m Patient Age:    34 years        BP:           124/74 mmHg Patient Gender: F               HR:           103 bpm. Exam Location:  Inpatient Procedure: 2D Echo, Cardiac Doppler and Color Doppler Indications:    Stroke 434.91/I163.9  History:        Patient has no prior history of Echocardiogram examinations. H/o                 SVT.   Sonographer:    Clayton Lefort RDCS (AE) Referring Phys: [phone] MCNEILL P Faulk  1. Left ventricular ejection fraction, by estimation, is 60 to 65%. The left ventricle has normal function. The left ventricle has no regional wall motion abnormalities. Left ventricular diastolic parameters were normal.  2. Right ventricular systolic function is normal. The right ventricular size is normal.  3. The mitral valve is normal in structure. Trivial mitral valve regurgitation. No evidence of mitral stenosis.  4. The aortic valve is normal in structure. Aortic valve regurgitation is not visualized. No aortic stenosis is present.  5. The inferior vena cava is normal in size with greater than 50% respiratory variability, suggesting right atrial pressure of 3 mmHg. FINDINGS  Left Ventricle: Left ventricular ejection fraction, by estimation, is 60 to 65%. The left ventricle has normal function. The left ventricle has no regional wall motion abnormalities. The left ventricular  internal cavity size was normal in size. There is  no left ventricular hypertrophy. Left ventricular diastolic parameters were normal. Normal left ventricular filling pressure. Right Ventricle: The right ventricular size is normal. No increase in right ventricular wall thickness. Right ventricular systolic function is normal. Left Atrium: Left atrial size was normal in size. Right Atrium: Right atrial size was normal in size. Pericardium: There is no evidence of pericardial effusion. Mitral Valve: The mitral valve is normal in structure. Normal mobility of the mitral valve leaflets. Trivial mitral valve regurgitation. No evidence of mitral valve stenosis. Tricuspid Valve: The tricuspid valve is normal in structure. Tricuspid valve regurgitation is not demonstrated. No evidence of tricuspid stenosis. Aortic Valve: The aortic valve is normal in structure. Aortic valve regurgitation is not visualized. No aortic stenosis is present. Aortic valve mean  gradient measures 5.0 mmHg. Aortic valve peak gradient measures 9.2 mmHg. Aortic valve area, by VTI measures 2.82 cm. Pulmonic Valve: The pulmonic valve was normal in structure. Pulmonic valve regurgitation is not visualized. No evidence of pulmonic stenosis. Aorta: The aortic root is normal in size and structure. Venous: The inferior vena cava is normal in size with greater than 50% respiratory variability, suggesting right atrial pressure of 3 mmHg. IAS/Shunts: No atrial level shunt detected by color flow Doppler.  LEFT VENTRICLE PLAX 2D LVIDd:         4.56 cm LVIDs:         3.58 cm LV PW:         0.99 cm LV IVS:        1.05 cm LVOT diam:     2.20 cm LV SV:         79 LV SV Index:   40 LVOT Area:     3.80 cm  RIGHT VENTRICLE             IVC RV Basal diam:  2.43 cm     IVC diam: 1.06 cm RV S prime:     15.20 cm/s TAPSE (M-mode): 2.3 cm LEFT ATRIUM             Index       RIGHT ATRIUM           Index LA diam:        3.60 cm 1.83 cm/m  RA Area:     12.50 cm LA Vol (A2C):   31.9 ml 16.22 ml/m RA Volume:   27.30 ml  13.88 ml/m LA Vol (A4C):   25.2 ml 12.81 ml/m LA Biplane Vol: 29.8 ml 15.15 ml/m  AORTIC VALVE AV Area (Vmax):    2.85 cm AV Area (Vmean):   2.49 cm AV Area (VTI):     2.82 cm AV Vmax:           152.00 cm/s AV Vmean:          111.000 cm/s AV VTI:            0.280 m AV Peak Grad:      9.2 mmHg AV Mean Grad:      5.0 mmHg LVOT Vmax:         114.00 cm/s LVOT Vmean:        72.800 cm/s LVOT VTI:          0.208 m LVOT/AV VTI ratio: 0.74  AORTA Ao Root diam: 3.10 cm Ao Asc diam:  2.80 cm  SHUNTS Systemic VTI:  0.21 m Systemic Diam: 2.20 cm Dani Gobble Croitoru MD Electronically signed by Sanda Klein  MD Signature Date/Time: 01/21/2020/4:23:51 PM    Final     PHYSICAL EXAM Constitutional: Appears well-developed and well-nourished.  Young Caucasian lady Psych: Affect appropriate to situation Eyes: No scleral injection HENT: No OP obstrucion Head: Normocephalic.  Cardiovascular: Normal rate and regular  rhythm.  Respiratory: Effort normal, non-labored breathing GI: Soft. No distension. There is no tenderness.  Skin: WDI  Neuro: Mental Status: Patient is awake, alert, oriented to person, place, month, year, and situation. Speech is slightly dysarthric, no difficulties with naming, repeating, comprehension.  Able to give a good history. Cranial Nerves: II: partial hemianopsia III,IV, VI: Right gaze preference, but able to cross midline. Pupils equal, round and reactive to light V: Facial sensation is symmetric to temperature VII: Left facial droop VIII: hearing is intact to voice X: Palat elevates symmetrically XI: Shoulder shrug is symmetric. XII: tongue is midline without atrophy or fasciculations.  Motor: Right upper and lower extremity 5/5.  Left upper extremity 5/5, with mild drift only.  left lower extremity 5/5, w/o drift.  Mild weakness of left grip and intrinsic hand muscles.  Orbits right over left upper extremity.  Fine finger movements and foot tapping are diminished on the left compared to the right. Drift Sensory: Sensation is symmetric to light touch and temperature in the arms and leg on the right Positive left-sided neglect but mild Deep Tendon Reflexes: 2+ and symmetric in the biceps and patellae.  Plantars: Toes are downgoing bilaterally.  Cerebellar: FNF and HKS on the right  NIHSS score 4. ASSESSMENT/PLAN Ms. Naysha Tuffy is a 34 year old female with no previous PMH or stroke risks. Embolic infarct, unclear etiology.  There was a CT change, and therefore the CT perfusion was deemed unreliable due to pseudonormalization, and therefore she was taken to MRI emergently.  The diffusion change on MRI appeared to be less than what was ischemic by CT perfusion and the diffusion/FLAIR mismatch was significant and therefore the decision was made to proceed with thrombectomy.  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  Hyperdense RMCA at bifurcation  CTA head & neck: R M1 thrombus, R M3 occlusion. No ICA stenosis  CT perfusion deemed unreliable; pseudonormalization noted    MRI : there was diffusion change on initial MRI; f/u MRI showed diffuse RMCA infarcts, tiny petechial hemorrhage noted.  2D Echo: 60% EF, no shunt or PFO noted. Will check TCD w/bubble and TEE as well as LE [place].   LDL 164  HgbA1c No results found for requested labs within last 26280 hours.  Lovenox for VTE prophylaxis    Diet   Diet Heart Room service appropriate? Yes; Fluid consistency: Thin     none prior to admission, now on ASA + Plavix for 3 weeks  Therapy recommendations: DAPT x3wk, then monotherapy with ASA only (unless need for [facility] is found)  Disposition:  tbd  NO known Hypertension . Long-term BP goal normotensive  Hyperlipidemia  Home meds: none  LDL 164, goal < 70  Add Lipitor 40mg  PO QHS  Continue statin at discharge   No known Diabetes Recent Labs    01/21/20 0928  GLUCAP 126*     Other Stroke Risk Factors  none  Hospital day # 1 Ok to transfer out of ICU to neuro floor today. Further wk up for young pt with emboic appearing stroke was d/w pt and husband. We will check TCD w/bubble and likely need TEE as well, LE [place] for dvts and hypercoag wk up pending.  Desiree Metzger-Cihelka, ARNP-C, ANVP-BC Pager: [phone]  I have personally obtained history,examined this patient, reviewed notes, independently viewed imaging studies, participated in medical decision making and plan of care.ROS completed by me personally and pertinent positives fully documented  I have made any additions or clarifications directly to the above note. Agree with note above.  She has presented with embolic right MCA infarct and underwent mechanical thrombectomy with partial recanalization.  MRI scan shows patchy right MCA infarct but patient seems to be clinically improving.  Recommend aspirin and Plavix for stroke  prevention for 3 weeks followed by aspirin alone.  Check TEE for cardiac source of embolism and TCD bubble study for PFO.  Check hypercoagulable panel labs, vasculitic labs, lower extremity venous Dopplers for DVT.  Mobilize out of bed.  Therapy consults.  Long discussion with patient, husband and mother at the bedside and answered questions.  Discussed with Dr. Norma Fredrickson.  Continue close neurological monitoring and strict blood pressure control as per post intervention protocol This patient is critically ill and at significant risk of neurological worsening, death and care requires constant monitoring of vital signs, hemodynamics,respiratory and cardiac monitoring, extensive review of multiple databases, frequent neurological assessment, discussion with family, other specialists and medical decision making of high complexity.I have made any additions or clarifications directly to the above note.This critical care time does not reflect procedure time, or teaching time or supervisory time of PA/NP/Med Resident etc but could involve care discussion time.  I spent 35 minutes of neurocritical care time  in the care of  this patient.     Antony Contras, MD Medical Director [facility] Pager: [phone] 01/22/2020 2:00 PM  To contact Stroke Continuity provider, please refer to [email or web address] After hours, contact General Neurology

## 2020-01-22 NOTE — Consult Note (Addendum)
Cardiology Consultation:   Patient ID: Glenda Hines MRN: [id number]; DOB: 1986-10-08  Admit date: 01/21/2020 Date of Consult: 01/22/2020  Primary Care Provider: No primary care provider on file. Primary Cardiologist: No primary care provider on file. Primary Electrophysiologist:  None    Patient Profile:   Glenda Hines is a 34 y.o. female with h/o SVT ablated 2013, migraines, 3 miscarriages, no DVT hx, recently on OBC, no other significant PMHx who is being seen today for the evaluation of monitoring recommendations s/p stroke at the request of Dr. Leonie Man.  EP history Saw Dr. Fatima Sanger, note reviewed 04/21/2014 History of Successful long RP tachycardia at the [facility] 1. 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 Postural orthostatic tachycardia  She was minimally symptomatic with some resting tachycardia, no need for further EP follow up  History of Present Illness:   Glenda Hines upon waking the date of her admission , not feeling well, husband noted left facial droop and she was unable to stand. EMS was called, she was found with an embolic stroke and brought emergently to IR  Neurology noted Scattered RMCA infarcts embolic etiology cryptogenic TCD noted likely small PFO, she is planned for TEE tomorrow  Neurology has asked EP to see her today to discuss monitoring recommendations with reports of palpitations prior to her stroke.  Past Medical History:  Diagnosis Date   Pituitary tumor    SVT (supraventricular tachycardia) [facility])     Past Surgical History:  Procedure Laterality Date   catheter ablation     IR CT HEAD LTD  01/21/2020   IR PERCUTANEOUS ART THROMBECTOMY/INFUSION INTRACRANIAL INC DIAG ANGIO  01/21/2020   KNEE SURGERY     RADIOLOGY WITH ANESTHESIA N/A 01/21/2020   Procedure: RADIOLOGY WITH ANESTHESIA;  Surgeon: Radiologist, Medication, MD;  Location: [facility];  Service: Radiology;  Laterality: N/A;    TONSILLECTOMY       Home Medications:  Prior to Admission medications   Medication Sig Start Date End Date Taking? Authorizing Provider  Multiple Vitamin (MULTIVITAMIN ADULT PO) Take 1 tablet by mouth daily.   Yes [provider]  norethindrone-ethinyl estradiol (LOESTRIN) 1-20 MG-MCG tablet Take 1 tablet by mouth daily. 11/12/19  Yes [provider]  Vitamin D-Vitamin K (VITAMIN K2-VITAMIN D3 PO) Take 1 tablet by mouth daily. "Health As It Ought To Be" 7500 iu, 256mg   Yes [provider]    Inpatient Medications: Scheduled Meds:   stroke: mapping our early stages of recovery book   Does not apply Once   aspirin EC  81 mg Oral Daily   atorvastatin  40 mg Oral Daily   Chlorhexidine Gluconate Cloth  6 each Topical Daily   clopidogrel  75 mg Oral Daily   enoxaparin (LOVENOX) injection  40 mg Subcutaneous Q24H   sodium chloride flush  3 mL Intravenous Once   Continuous Infusions:  sodium chloride Stopped (01/21/20 2116)   niCARDipine     PRN Meds: acetaminophen **OR** acetaminophen (TYLENOL) oral liquid 160 mg/5 mL **OR** acetaminophen  Allergies:    Allergies  Allergen Reactions   Oxycodone Shortness Of Breath   Vancomycin Rash   Squid Oil Nausea And Vomiting    Squid    Sulfa Antibiotics Rash   Tape Rash    Social History:   Social History   Socioeconomic History   Marital status: Married    Spouse name: Not on file   Number of children: Not on file   Years  of education: Not on file   Highest education level: Not on file  Occupational History   Not on file  Tobacco Use   Smoking status: Not on file  Substance and Sexual Activity   Alcohol use: No   Drug use: No   Sexual activity: Never  Other Topics Concern   Not on file  Social History Narrative   Not on file   Social Determinants of Health   Financial Resource Strain:    Difficulty of Paying Living Expenses:   Food Insecurity:    Worried About [place] in the Last  Year:    Arboriculturist in the Last Year:   Transportation Needs:    Film/video editor (Medical):    Lack of Transportation (Non-Medical):   Physical Activity:    Days of Exercise per Week:    Minutes of Exercise per Session:   Stress:    Feeling of Stress :   Social Connections:    Frequency of Communication with Friends and Family:    Frequency of Social Gatherings with Friends and Family:    Attends Religious Services:    Active Member of Clubs or Organizations:    Attends Music therapist:    Marital Status:   Intimate Partner Violence:    Fear of Current or Ex-Partner:    Emotionally Abused:    Physically Abused:    Sexually Abused:     Family History:   Family History  Problem Relation Age of Onset   Hypertension Mother    Diabetes Father      ROS:  Please see the history of present illness.  All other ROS reviewed and negative.     Physical Exam/Data:   Vitals:   01/22/20 1100 01/22/20 1200 01/22/20 1300 01/22/20 1400  BP: 125/70 118/74 119/70 106/69  Pulse:  83    Resp: 15 (!) 21 (!) 24 20  Temp:  98.6 F (37 C)    TempSrc:  Oral    SpO2:  99%    Weight:      Height:        Intake/Output Summary (Last 24 hours) at 01/22/2020 1556 Last data filed at 01/22/2020 1100 Gross per 24 hour  Intake 897.83 ml  Output 2275 ml  Net -1377.17 ml   Last 3 Weights 01/21/2020 09/09/2012  Weight (lbs) 184 lb 15.5 oz 155 lb 6.4 oz  Weight (kg) 83.9 kg 70.489 kg     Body mass index is 28.54 kg/m.  General:  Well nourished, well developed, in no acute distress HEENT: normal Lymph: no adenopathy Neck: no JVD Endocrine:  No thryomegaly Vascular: No carotid bruits  Cardiac:   RRR; no murmurs, gallops or rubs Lungs: CTA b/l, no wheezing, rhonchi or rales  Abd: soft, nontender, no hepatomegaly  Ext: no edema Musculoskeletal:  No deformities, Skin: warm and dry  Neuro:  L facial droop, full neuro exam no performed Psych:  Normal affect   EKG:   The EKG was personally reviewed and demonstrates:    No EKGs in Epic, care everywhere is reviewed, I was unable to get report or images of her EKGs to review  Telemetry:  Telemetry was personally reviewed and demonstrates:   SR, some AT 110's  Relevant CV Studies:  01/21/2020: TTE IMPRESSIONS   1. Left ventricular ejection fraction, by estimation, is 60 to 65%. The  left ventricle has normal function. The left ventricle has no regional  wall motion abnormalities. Left  ventricular diastolic parameters were  normal.   2. Right ventricular systolic function is normal. The right ventricular  size is normal.   3. The mitral valve is normal in structure. Trivial mitral valve  regurgitation. No evidence of mitral stenosis.   4. The aortic valve is normal in structure. Aortic valve regurgitation is  not visualized. No aortic stenosis is present.   5. The inferior vena cava is normal in size with greater than 50%  respiratory variability, suggesting right atrial pressure of 3 mmHg.   Laboratory Data:  High Sensitivity Troponin:  No results for input(s): TROPONINIHS in the last 720 hours.   Chemistry Recent Labs  Lab 01/21/20 0930 01/21/20 0938  NA 138 140  K 3.6 3.7  CL 105 105  CO2 23  --   GLUCOSE 154* 148*  BUN 10 11  CREATININE 0.84 0.70  CALCIUM 8.8*  --   GFRNONAA >60  --   GFRAA >60  --   ANIONGAP 10  --     Recent Labs  Lab 01/21/20 0930  PROT 7.0  ALBUMIN 3.6  AST 17  ALT 15  ALKPHOS 38  BILITOT 0.7   Hematology Recent Labs  Lab 01/21/20 0930 01/21/20 0938  WBC 5.0  --   RBC 4.36  --   HGB 12.9 12.2  HCT 38.5 36.0  MCV 88.3  --   MCH 29.6  --   MCHC 33.5  --   RDW 12.4  --   PLT 273  --    BNPNo results for input(s): BNP, PROBNP in the last 168 hours.  DDimer No results for input(s): DDIMER in the last 168 hours.   Radiology/Studies:  CT ANGIO HEAD W OR WO CONTRAST Result Date: 01/21/2020 CLINICAL DATA:  Code stroke. EXAM: CT ANGIOGRAPHY HEAD  AND NECK CT PERFUSION BRAIN TECHNIQUE: Multidetector CT imaging of the head and neck was performed using the Hines protocol during bolus administration of intravenous contrast. Multiplanar CT image reconstructions and MIPs were obtained to evaluate the vascular anatomy. Carotid stenosis measurements (when applicable) are obtained utilizing NASCET criteria, using the distal internal carotid diameter as the denominator. Multiphase CT imaging of the brain was performed following IV bolus contrast injection. Subsequent parametric perfusion maps were calculated using RAPID software. CONTRAST:  168m OMNIPAQUE IOHEXOL 350 MG/ML SOLN COMPARISON:  Noncontrast head CT earlier today FINDINGS: CTA NECK FINDINGS Aortic arch: Unremarkable Right carotid system: Vessels are smooth and widely patent. No atheromatous changes Left carotid system: This vessels are smooth and widely patent. No atheromatous changes. Vertebral arteries: No proximal subclavian stenosis. The vertebral arteries are smooth and widely patent. Skeleton: No acute finding Other neck: No acute finding Upper chest: Negative Review of the MIP images confirms the above findings CTA HEAD FINDINGS Anterior circulation: Irregularity along the anterior wall of the right MCA bifurcation with moderate stenosis. There is a right M3 branch occlusion seen subsequently. On MIPS there is operculum ir pruning suggesting additional peripheral emboli. Non enhancement of the right pericallosal artery. Posterior circulation: Head the vertebral and basilar arteries are smooth and widely patent. Fetal type left PCA. The posterior cerebral arteries are smooth and widely patent. Negative for aneurysm Venous sinuses: Negative Anatomic variants: As above Review of the MIP images confirms the above findings CT Brain Perfusion Findings: ASPECTS: 7 or 8 CBF (<30%) Volume: 056man underestimation Perfusion (Tmax>6.0s) volume: 130 primarily in the upper division right MCA territory but also  seen in the distal right ACA territory and to a  lesser extent in the left frontal pole. Mismatch Volume: Not applicable Critical Value/emergent results were called by telephone at the time of interpretation on 01/21/2020 at 9:53 am to provider Leonel Ramsay, who verbally acknowledged these results. IMPRESSION: 1. No emergent large vessel occlusion but there is irregular luminal thrombus at the right M1 segment causing moderate stenosis. 2. Right M3 branch occlusion adjacent to an acute infarct by noncontrast CT. Probable right pericallosal embolism. 3. Underestimated core infarct by CT perfusion when compared with aspects. There is a 130 cc territory of delayed/ischemic blood flow mainly in the upper division right MCA territory and right ACA territory. 4. Small area of ischemic blood flow in the left frontal pole where there was suspected cortical infarct by noncontrast CT. 5. No stenosis or embolic source seen in the neck. Electronically Signed   By: Monte Fantasia M.D.   On: 01/21/2020 10:03      MR BRAIN WO CONTRAST Result Date: 01/21/2020 CLINICAL DATA:  Right-sided gaze with left-sided weakness EXAM: MRI HEAD WITHOUT CONTRAST TECHNIQUE: Multiplanar, multiecho pulse sequences of the brain and surrounding structures were obtained without intravenous contrast. COMPARISON:  Head CT 01/21/2020 FINDINGS: Brain: Anterior right MCA territory acute cortical infarct predominantly affecting the posterior right frontal cortex and operculum. There is also acute ischemia the right basal ganglia. Multiple smaller foci of ischemia in the posterior right hemisphere, still within the MCA territory. No acute ischemia within any other vascular territory. Normal white matter signal. Normal volume of CSF spaces. Multiple punctate foci of magnetic susceptibility effect within the right hemisphere, likely indicating petechial hemorrhage. Cavum septum pellucidum et vergae. No midline shift, ventricular entrapment or other mass  effect. Vascular: Normal flow voids. Skull and upper cervical spine: Normal marrow signal. Sinuses/Orbits: Negative. Other: None. IMPRESSION: 1. Anterior right MCA territory acute infarct predominantly affecting the posterior and opercular cortices of the frontal lobe. Multiple smaller foci of ischemia in the posterior right hemisphere, still within the MCA territory. 2. Multiple punctate foci of magnetic susceptibility effect within the right hemisphere, likely indicating petechial hemorrhage. Electronically Signed   By: Ulyses Jarred M.D.   On: 01/21/2020 22:35     IR CT Head Ltd Result Date: 01/22/2020 INDICATION: 34 year old female with past medical history is pituitary tumor, supraventricular tachycardia and prior miscarriages. She presented to ED the with right-sided gaze preference, left sided neglect and left-sided facial droop. NIHSS 10. Premorbid Rankin scale 0. Last seen well 22:00 on 01/20/2020. No intravenous tPA as she was outside the window. Head CT showed loss of gray-white differentiation the right frontal and anterior temporal lobes as well as right putamen with hyperdense right MCA bifurcation. CT angiogram showed subocclusive clot in the distal right M1 extending to the origin of the right M2/superior division branch, occlusion of a right M3/MCA middle division branch and right pericallosal artery distal to the origin of the callosomarginal artery. CT perfusion showed no evidence of core infarct with a a number of of 130 mL. Given discordance between noncontrast head CT and CT perfusion, an MRI of the brain was obtained an showed a posterior right frontal and small parietal areas of restricted diffusion, predominantly cortical/subcortical with minimal corresponding increased signal on FLAIR. There was slow flow in cortical right MCA and ACA branches. Decision was made to proceed with mechanical thrombectomy. EXAM: Diagnostic cerebral angiogram. Mechanical thrombectomy. Flat panel head CT.  COMPARISON:  CT/CT angiogram of the head and neck January 21, 2020. MEDICATIONS: 15 mg of verapamil intra arterial. ANESTHESIA/SEDATION: The procedure  was performed under general anesthesia. CONTRAST:  70 mL Omnipaque 240 FLUOROSCOPY TIME:  Fluoroscopy Time: 64 minutes 42 seconds (1,295 mGy). COMPLICATIONS: SIR Level A - No therapy, no consequence. TECHNIQUE: Informed written consent was obtained from the the patient and her mother after a thorough discussion of the procedural risks, benefits and alternatives. All questions were addressed. Maximal Sterile Barrier Technique was utilized including caps, mask, sterile gowns, sterile gloves, sterile drape, hand hygiene and skin antiseptic. A timeout was performed prior to the initiation of the procedure. Using a micropuncture kit and the modified Seldinger technique, access was gained to the right common femoral artery and an 8 French sheath was placed. Under fluoroscopy, an 8 French walrus balloon guide catheter was navigated over a 6 [place] Berenstein 2 catheter and a 0.035 inch Terumo Glidewire into the aortic arch. The catheter was placed in the right common carotid artery and then advanced into the right internal carotid artery. Frontal and lateral angiograms of the head were obtained. FINDINGS: 1. Subocclusive clot in the distal M1 segment of the right MCA described on prior CT angiogram is no longer seen. 2. Interval migration of the right M2/MCA superior division clot to an M3 segment. 3. Persistent occlusion of a right M3/MCA middle division branch. 4. Occlusion of 2 M4/MCA posterior division branches. 5. Persistent occlusion of the right pericallosal artery just distal to the origin of the callosomarginal artery. PROCEDURE: Under biplane roadmap, a Catalyst 6 aspiration catheter was navigated over a phenom 21 microcatheter and a synchro support microguidewire into the cavernous segment of the right ICA. The microcatheter was then navigated over the wire into the  right M3/MCA middle division branch. Then, a 3 mm solitaire stent retriever was deployed spanning the distal M2 and proximal M3/MCA middle division branch. The device was allowed to intercalated with the clot for 4 minutes. The aspiration catheter was advanced to the M1 segment and connected to a penumbra aspiration pump. The guiding catheter balloon was inflated. The thrombectomy device and aspiration catheter were removed under constant aspiration. Right ICA angiograms were obtained showing persistent right M3/MCA middle division branch occlusion. Vasa spasm at the tip of the balloon guide catheter in the right ICA as well as in the M1 through M3 segments was noted. Intra arterial infusion of 5 mg of verapamil was performed over 10 minutes for device induced vasospasm treatment. Right ICA angiograms with frontal and lateral views of the head were obtained in showed improvement of the right ICA and MCA vasospasm. Under biplane roadmap, a Catalyst 6 aspiration catheter was navigated over a phenom 21 microcatheter and a synchro support microguidewire into the cavernous segment of the right ICA. The microcatheter was then navigated over the wire into the right MCA/M3 anterior division branch. Resistance advancing the catheter over the wire across the occlusion point with stretching of the vessels was noted. Attempt was aborted. Right ICA angiograms with frontal and lateral views of the head were obtained in showed no evidence of active contrast extravasation. Under biplane roadmap, a 3Max aspiration catheter was navigated over an Aristotle 14 micro guidewire into the right ACA/A3 segment, at the level of occlusion. Continuous aspiration was applied for 4 minutes. The catheter was then slowly retracted under continuous aspiration. Follow-up right ICA angiograms showed complete pericallosal artery recanalization. Flat panel CT of the head was obtained and post processed in a separate workstation with concurrent  attending physician supervision. Selected images were sent to PACS. Small focus of subarachnoid hemorrhage was noted in  the anterior right frontal region. Under biplane roadmap, a 3Max aspiration catheter was navigated over an Aristotle 14 micro guidewire into theright M3/MCA middle division branch, at the level of occlusion. Continuous aspiration was applied for 4 minutes. The catheter was then slowly retracted under continuous aspiration. Follow-up right ICA angiograms were obtained showing persistent occlusion of the right M3/MCA middle division branch. The guiding catheter was retracted to the level of the right common carotid artery and a fluoroscopy loop was obtained showing moderate vasospasm at the upper cervical segment of the right ICA. Intra arterial infusion of 10 mg of verapamil was performed over 10 minutes for device induced vasospasm treatment. Subsequent fluoroscopy loop showed resolution of the vasospasm. Angiogram was obtained via femoral sheath side port showing puncture at the level of the common femoral artery which has normal caliber. The sheath was exchanged over the wire for a Perclose ProGlide which was used for access closure. Immediate hemostasis was achieved IMPRESSION: 1. Interval fragmentation of the subocclusive right M1/MCA thrombus and distal migration of the right M2/MCA anterior division branch clot into and M3 segment. Initial TICI score was 2B and remained the same despite efforts to recanalize the anterior and medial M3 branches. 2. Persistent occlusion of the right pericallosal artery successfully recanalized with contact aspiration. Initial TICI score was 2A and final TICI score was 3. 3. Small focal subarachnoid hemorrhage the right anterior frontal region. PLAN: Patient was transferred to ICU for continued monitoring. Stroke workup per neurology team. Electronically Signed   By: Pedro Earls M.D.   On: 01/22/2020 14:18    VAS [place] TRANSCRANIAL DOPPLER W  BUBBLES Result Date: 01/22/2020  Transcranial Doppler with Bubble Indications: Stroke. Performing Technologist: June Leap RDMS, RVT  Examination Guidelines: A complete evaluation includes B-mode imaging, spectral Doppler, color Doppler, and power Doppler as needed of all accessible portions of each vessel. Bilateral testing is considered an integral part of a complete examination. Limited examinations for reoccurring indications may be performed as noted.  Summary:  A vascular evaluation was performed. The right middle cerebral artery was studied. An IV was inserted into the patient's left hand. Verbal informed consent was obtained.  Trivial HITS at rest. Mild HITS during Valsalva. Probable small PFO. *See table(s) above for TCD measurements and observations.    Preliminary      CT HEAD CODE STROKE WO CONTRAST Result Date: 01/21/2020 CLINICAL DATA:  Code stroke.  Code stroke with left-sided weakness EXAM: CT HEAD WITHOUT CONTRAST TECHNIQUE: Contiguous axial images were obtained from the base of the skull through the vertex without intravenous contrast. COMPARISON:  None. FINDINGS: Brain: Loss of gray-white differentiation along the lateral right frontal lobe and superficial right temporal lobe. Equivocal low-density at the right putamen. Question small area of gray-white differentiation loss in the high and parasagittal left frontal lobe. No acute hemorrhage, hydrocephalus, or masslike finding. There is a cavum septum pellucidum et vergae Vascular: Hyperdense right MCA at the bifurcation and M2 level Skull: Negative Sinuses/Orbits: Gaze to the right Other: These results were communicated to Dr. Leonel Ramsay at 9:42 amon 4/13/2021by text page via the [facility] messaging system. CTA and perfusion is already pending. ASPECTS [facility] Stroke Program Early CT Score) - Ganglionic level infarction (caudate, lentiform nuclei, internal capsule, insula, M1-M3 cortex): 5/6 - Supraganglionic infarction (M4-M6 cortex): 2  Total score (0-10 with 10 being normal): 7/8 IMPRESSION: 1. Hyperdense right MCA bifurcation.  ASPECTS is 7 or 8. 2. Question small left ACA distribution cortex infarct. Electronically Signed  By: Monte Fantasia M.D.   On: 01/21/2020 09:45     VAS [place] LOWER EXTREMITY VENOUS (DVT) Result Date: 01/22/2020  Lower Venous DVTStudy Indications: Stroke, and PFO.  Comparison Study: no prior Performing Technologist: June Leap RDMS, RVT  Examination Guidelines: A complete evaluation includes B-mode imaging, spectral Doppler, color Doppler, and power Doppler as needed of all accessible portions of each vessel. Bilateral testing is considered an integral part of a complete examination. Limited examinations for reoccurring indications may be performed as noted. The reflux portion of the exam is performed with the patient in reverse Trendelenburg.   Summary: RIGHT: - There is no evidence of deep vein thrombosis in the lower extremity.  - No cystic structure found in the popliteal fossa.  LEFT: - There is no evidence of deep vein thrombosis in the lower extremity.  - No cystic structure found in the popliteal fossa.  *See table(s) above for measurements and observations.    Preliminary    { Assessment and Plan:   Cryptogenic stroke  She has some palpitations few and far between, these are a skip or extra beat.  The weekend prior to her stroke she had some awareness of these more so.  She has not had any persistent palpitations, no symptoms of her SVT post ablation She has been told of PACs and some PVCs by remote monitoring Never any AFib   She has TEE planned for tomorrow She was on OBC, stopped 5-6 days ago No DVT, small PFO by TCD study  She carries a diagnosis of POTS though she reports there has been disagreement between her prior cardiologists about this one saying no and another saying yes, She is not inclined to want to consider a loop implant, though would be ok with wearable monitor  Dr.  Curt Bears Lelah Rennaker see later today           For questions or updates, please contact [facility] Please consult [email or web address] for contact info under     Signed, Baldwin Jamaica, PA-C  01/22/2020 3:56 PM  I have seen and examined this patient with Glenda Hines.  Agree with above, note added to reflect my findings.  On exam, RRR, no murmurs, lungs clear.  Patient presented to the hospital with facial droop and left-sided weakness.  Imaging showed cryptogenic stroke due to likely embolic phenomenon.  She had a thrombectomy performed.  She does have residual facial droop, though she is improving.  She had a TTE that showed a PFO with a plan for TEE tomorrow to further evaluate.  She does have a history of SVT, PACs, and PVCs but no known atrial fibrillation.  I do feel that further monitoring is warranted.  We Eldonna Neuenfeldt fit her with a 14-day monitor.  If this does not show atrial fibrillation, we Edwar Coe bring her back to clinic for further discussion of Linq monitor.  Carmon Brigandi M. Echo Allsbrook MD 01/22/2020 5:28 PM

## 2020-01-22 NOTE — Evaluation (Signed)
Occupational Therapy Evaluation Patient Details Name: Glenda Hines MRN: [id number] DOB: 1985-12-31 Today's Date: 01/22/2020    History of Present Illness 34 y.o. female admitted on 01/21/20 for L sided weakness/facial droop.  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  Pt with significant PMH of SVT, pituitary tumor, multiple R knee surgeries.     Clinical Impression   PTA pt living independently, caring for her two young boys full time and very active. At time of eval, pt is able to complete transfers and functional mobility at supervision level of assist. Pt does present with higher level balance deficits if comparing to her very independent baseline as an active young mother of two boys. Noted mild L inattention with visually scanning for items on menu and when navigating environment. Educated pt and spouse on inattention, as well as safety and management for higher level BADL and safety concerns. LUE coordination deficits, proprioceptive deficits, and FMC deficits noted as well. Pt showing decreased awareness, slower processing, and some emotional lability throughout session. Overall, affect is very flat with little expressive reactions to emotions or transition time. Will need further follow up to see what is affect vs processing new medical event. Recommend pt follow up with OP OT to help facilitate very independent and active baseline. Will continue to follow per POC listed below.     Follow Up Recommendations  Outpatient OT    Equipment Recommendations  None recommended by OT    Recommendations for Other Services       Precautions / Restrictions Precautions Precautions: Other (comment) Precaution Comments: mild left inattention Restrictions Weight Bearing Restrictions: No      Mobility Bed Mobility               General bed mobility comments:  Pt was OOB in the recliner chair.   Transfers Overall transfer level: Needs assistance Equipment used: None Transfers: Sit to/from Stand Sit to Stand: Supervision         General transfer comment: supervision for safety    Balance Overall balance assessment: Needs assistance Sitting-balance support: Feet supported;No upper extremity supported Sitting balance-Leahy Scale: Good     Standing balance support: No upper extremity supported Standing balance-Leahy Scale: Good Standing balance comment: able to stand at sink without external assist, able to pick up item off of floor with slow caution                 Standardized Balance Assessment Standardized Balance Assessment : Dynamic Gait Index        ADL either performed or assessed with clinical judgement   ADL Overall ADL's : Modified independent(to supervision for higher level tasks)                                       General ADL Comments: Pt demonstrates ability to complete BADL at mod I- supervision level for safety. Pt showing mild L inattention with decreased safety awareness. Increased supervision is needed to manage this with more dynamic tasks. Education given to husband and pt on management strategies for L inattention. Pt was able to stand at sink, complete toilet transfer, and beyond household level of functional mobility without assist.     Vision Patient Visual Report: Blurring of vision Additional Comments: pt reports difficulty focusin on phone. Educated  pt and husband that this may be more eye fatigue due to new insult to brain. Educated pt and spouse on signs and symptoms with visual changes and what would warrant concern. Also educated on visual vs sensory deficits     Perception     Praxis      Pertinent Vitals/Pain Pain Assessment: Faces Faces Pain Scale: Hurts a little bit Pain Location: headache Pain Descriptors / Indicators: Aching Pain Intervention(s): Monitored during  session     Hand Dominance Right   Extremity/Trunk Assessment Upper Extremity Assessment Upper Extremity Assessment: LUE deficits/detail;RUE deficits/detail RUE Deficits / Details: WFL LUE Deficits / Details: decreased proprioception, decreased FMC, decreased coordination with finger to nose test; intermittent parasthesia LUE Sensation: decreased proprioception LUE Coordination: decreased fine motor;decreased gross motor   Lower Extremity Assessment Lower Extremity Assessment: Defer to PT evaluation   Cervical / Trunk Assessment Cervical / Trunk Assessment: Normal   Communication Communication Communication: No difficulties   Cognition Arousal/Alertness: Awake/alert Behavior During Therapy: Flat affect Overall Cognitive Status: Impaired/Different from baseline Area of Impairment: Awareness;Problem solving                           Awareness: Emergent Problem Solving: Slow processing General Comments: pt presenting with flat affect, limited animation with emotions. Pt showing some lability, going from sobbing to flat (but also could be to new onset information and stroke). Pt needing some increased time to process and had decreased awareness to L sided deficits and how this may impact her caring for her children going home from hospital   General Comments       Exercises     Shoulder Instructions      Home Living Family/patient expects to be discharged to:: Private residence Living Arrangements: Spouse/significant other Available Help at Discharge: Family;Available 24 hours/day Type of Home: House Home Access: Stairs to enter [employer] of Steps: 3 Entrance Stairs-Rails: None Home Layout: One level     Bathroom Shower/Tub: Tub/shower unit;Walk-in shower         Home Equipment: None   Additional Comments: going to stay with parents, above reflects their home  Lives With: Spouse;Son    Prior Functioning/Environment Level of  Independence: Independent        Comments: takes care of her 46 and 34 year old boys full time        OT Problem List: Decreased strength;Decreased knowledge of use of DME or AE;Decreased coordination;Decreased activity tolerance;Decreased cognition;Decreased safety awareness;Impaired balance (sitting and/or standing)      OT Treatment/Interventions: Self-care/ADL training;Therapeutic exercise;Patient/family education;Visual/perceptual remediation/compensation;Neuromuscular education;Balance training;Energy conservation;Therapeutic activities;DME and/or AE instruction;Cognitive remediation/compensation    OT Goals(Current goals can be found in the care plan section) Acute Rehab OT Goals Patient Stated Goal: to get back to normal so she can continue to care for her children as she did before.  OT Goal Formulation: With patient Time For Goal Achievement: 02/05/20 Potential to Achieve Goals: Good  OT Frequency: Min 2X/week   Barriers to D/C:            Co-evaluation PT/OT/SLP Co-Evaluation/Treatment: Yes Reason for Co-Treatment: Complexity of the patient's impairments (multi-system involvement) PT goals addressed during session: Mobility/safety with mobility;Balance OT goals addressed during session: ADL's and self-care;Strengthening/ROM      AM-PAC OT "6 Clicks" Daily Activity     Outcome Measure Help from another person eating meals?: None Help from another person taking care of personal grooming?: None Help from another person toileting,  which includes using toliet, bedpan, or urinal?: None Help from another person bathing (including washing, rinsing, drying)?: None Help from another person to put on and taking off regular upper body clothing?: None Help from another person to put on and taking off regular lower body clothing?: None 6 Click Score: 24   End of Session Nurse Communication: Mobility status;Other (comment)(L hand tingling)  Activity Tolerance: Patient tolerated  treatment well Patient left: in chair;with call bell/phone within reach;with family/visitor present  OT Visit Diagnosis: Unsteadiness on feet (R26.81);Other symptoms and signs involving the nervous system (R29.898);Other symptoms and signs involving cognitive function;Muscle weakness (generalized) (M62.81)                Time: 1026-1100 OT Time Calculation (min): 34 min Charges:  OT General Charges $OT Visit: 1 Visit OT Evaluation $OT Eval Moderate Complexity: [place], MSOT, OTR/L [place] [facility] Office Number: [phone] Pager: [phone]  Zenovia Jarred 01/22/2020, 4:57 PM

## 2020-01-22 NOTE — Progress Notes (Signed)
Referring Physician(s): Code Stroke- Roland Rack P  Supervising Physician: Pedro Earls  Patient Status:  [name] - In-pt  Chief Complaint: "Why did I have a stroke?"  Subjective:  History of acute CVA s/p cerebral arteriogram with emergent mechanical thrombectomy of right MCA M3 anterior division occlusion achieving a TICI 2b revascularization and right pericallosal occlusion achieving a TICI 3 revascularization via right femoral approach 01/21/2020 by Dr. Karenann Cai. Patient awake and alert sitting in bed. Accompanied by husband at bedside. She is asking why she had her stroke, explained that the neurology department is completing her work-up to try and figure out cause of her stroke. Can spontaneously move all extremities. Left facial droop noted. Right groin incision c/d/i.   Allergies: Oxycodone, Vancomycin, Squid oil, Sulfa antibiotics, and Tape  Medications: Prior to Admission medications   Medication Sig Start Date End Date Taking? Authorizing Provider  Multiple Vitamin (MULTIVITAMIN ADULT PO) Take 1 tablet by mouth daily.   Yes [provider]  norethindrone-ethinyl estradiol (LOESTRIN) 1-20 MG-MCG tablet Take 1 tablet by mouth daily. 11/12/19  Yes [provider]  Vitamin D-Vitamin K (VITAMIN K2-VITAMIN D3 PO) Take 1 tablet by mouth daily. "Health As It Ought To Be" 7500 iu, 243mcg   Yes [provider]     Vital Signs: BP 123/86   Pulse 84   Temp 98.8 F (37.1 C) (Oral)   Resp (!) 22   Ht 5' 7.5" (1.715 m)   Wt 184 lb 15.5 oz (83.9 kg)   SpO2 98%   BMI 28.54 kg/m   Physical Exam Vitals and nursing note reviewed.  Constitutional:      General: She is not in acute distress.    Appearance: Normal appearance.  Pulmonary:     Effort: Pulmonary effort is normal. No respiratory distress.  Skin:    General: Skin is warm and dry.     Comments: Right groin incision soft without active bleeding or  hematoma.  Neurological:     Mental Status: She is alert.     Comments: Alert, awake, and oriented x3. Speech and comprehension intact. PERRL bilaterally. Left facial droop. Tongue midline. Can spontaneously move all extremities. No pronator drift. Distal pulses (DPs) 2+ bilaterally.     Imaging: CT ANGIO HEAD W OR WO CONTRAST  Result Date: 01/21/2020 CLINICAL DATA:  Code stroke. EXAM: CT ANGIOGRAPHY HEAD AND NECK CT PERFUSION BRAIN TECHNIQUE: Multidetector CT imaging of the head and neck was performed using the standard protocol during bolus administration of intravenous contrast. Multiplanar CT image reconstructions and MIPs were obtained to evaluate the vascular anatomy. Carotid stenosis measurements (when applicable) are obtained utilizing NASCET criteria, using the distal internal carotid diameter as the denominator. Multiphase CT imaging of the brain was performed following IV bolus contrast injection. Subsequent parametric perfusion maps were calculated using RAPID software. CONTRAST:  129mL OMNIPAQUE IOHEXOL 350 MG/ML SOLN COMPARISON:  Noncontrast head CT earlier today FINDINGS: CTA NECK FINDINGS Aortic arch: Unremarkable Right carotid system: Vessels are smooth and widely patent. No atheromatous changes Left carotid system: This vessels are smooth and widely patent. No atheromatous changes. Vertebral arteries: No proximal subclavian stenosis. The vertebral arteries are smooth and widely patent. Skeleton: No acute finding Other neck: No acute finding Upper chest: Negative Review of the MIP images confirms the above findings CTA HEAD FINDINGS Anterior circulation: Irregularity along the anterior wall of the right MCA bifurcation with moderate stenosis. There is a right M3 branch occlusion seen subsequently.  On MIPS there is operculum ir pruning suggesting additional peripheral emboli. Non enhancement of the right pericallosal artery. Posterior circulation: Head the vertebral and basilar  arteries are smooth and widely patent. Fetal type left PCA. The posterior cerebral arteries are smooth and widely patent. Negative for aneurysm Venous sinuses: Negative Anatomic variants: As above Review of the MIP images confirms the above findings CT Brain Perfusion Findings: ASPECTS: 7 or 8 CBF (<30%) Volume: 3mL-an underestimation Perfusion (Tmax>6.0s) volume: 130 primarily in the upper division right MCA territory but also seen in the distal right ACA territory and to a lesser extent in the left frontal pole. Mismatch Volume: Not applicable Critical Value/emergent results were called by telephone at the time of interpretation on 01/21/2020 at 9:53 am to provider Leonel Ramsay, who verbally acknowledged these results. IMPRESSION: 1. No emergent large vessel occlusion but there is irregular luminal thrombus at the right M1 segment causing moderate stenosis. 2. Right M3 branch occlusion adjacent to an acute infarct by noncontrast CT. Probable right pericallosal embolism. 3. Underestimated core infarct by CT perfusion when compared with aspects. There is a 130 cc territory of delayed/ischemic blood flow mainly in the upper division right MCA territory and right ACA territory. 4. Small area of ischemic blood flow in the left frontal pole where there was suspected cortical infarct by noncontrast CT. 5. No stenosis or embolic source seen in the neck. Electronically Signed   By: Monte Fantasia M.D.   On: 01/21/2020 10:03   CT ANGIO NECK W OR WO CONTRAST  Result Date: 01/21/2020 CLINICAL DATA:  Code stroke. EXAM: CT ANGIOGRAPHY HEAD AND NECK CT PERFUSION BRAIN TECHNIQUE: Multidetector CT imaging of the head and neck was performed using the standard protocol during bolus administration of intravenous contrast. Multiplanar CT image reconstructions and MIPs were obtained to evaluate the vascular anatomy. Carotid stenosis measurements (when applicable) are obtained utilizing NASCET criteria, using the distal internal  carotid diameter as the denominator. Multiphase CT imaging of the brain was performed following IV bolus contrast injection. Subsequent parametric perfusion maps were calculated using RAPID software. CONTRAST:  121mL OMNIPAQUE IOHEXOL 350 MG/ML SOLN COMPARISON:  Noncontrast head CT earlier today FINDINGS: CTA NECK FINDINGS Aortic arch: Unremarkable Right carotid system: Vessels are smooth and widely patent. No atheromatous changes Left carotid system: This vessels are smooth and widely patent. No atheromatous changes. Vertebral arteries: No proximal subclavian stenosis. The vertebral arteries are smooth and widely patent. Skeleton: No acute finding Other neck: No acute finding Upper chest: Negative Review of the MIP images confirms the above findings CTA HEAD FINDINGS Anterior circulation: Irregularity along the anterior wall of the right MCA bifurcation with moderate stenosis. There is a right M3 branch occlusion seen subsequently. On MIPS there is operculum ir pruning suggesting additional peripheral emboli. Non enhancement of the right pericallosal artery. Posterior circulation: Head the vertebral and basilar arteries are smooth and widely patent. Fetal type left PCA. The posterior cerebral arteries are smooth and widely patent. Negative for aneurysm Venous sinuses: Negative Anatomic variants: As above Review of the MIP images confirms the above findings CT Brain Perfusion Findings: ASPECTS: 7 or 8 CBF (<30%) Volume: 60mL-an underestimation Perfusion (Tmax>6.0s) volume: 130 primarily in the upper division right MCA territory but also seen in the distal right ACA territory and to a lesser extent in the left frontal pole. Mismatch Volume: Not applicable Critical Value/emergent results were called by telephone at the time of interpretation on 01/21/2020 at 9:53 am to provider Leonel Ramsay, who verbally acknowledged these  results. IMPRESSION: 1. No emergent large vessel occlusion but there is irregular luminal thrombus  at the right M1 segment causing moderate stenosis. 2. Right M3 branch occlusion adjacent to an acute infarct by noncontrast CT. Probable right pericallosal embolism. 3. Underestimated core infarct by CT perfusion when compared with aspects. There is a 130 cc territory of delayed/ischemic blood flow mainly in the upper division right MCA territory and right ACA territory. 4. Small area of ischemic blood flow in the left frontal pole where there was suspected cortical infarct by noncontrast CT. 5. No stenosis or embolic source seen in the neck. Electronically Signed   By: Monte Fantasia M.D.   On: 01/21/2020 10:03   MR BRAIN WO CONTRAST  Result Date: 01/21/2020 CLINICAL DATA:  Right-sided gaze with left-sided weakness EXAM: MRI HEAD WITHOUT CONTRAST TECHNIQUE: Multiplanar, multiecho pulse sequences of the brain and surrounding structures were obtained without intravenous contrast. COMPARISON:  Head CT 01/21/2020 FINDINGS: Brain: Anterior right MCA territory acute cortical infarct predominantly affecting the posterior right frontal cortex and operculum. There is also acute ischemia the right basal ganglia. Multiple smaller foci of ischemia in the posterior right hemisphere, still within the MCA territory. No acute ischemia within any other vascular territory. Normal white matter signal. Normal volume of CSF spaces. Multiple punctate foci of magnetic susceptibility effect within the right hemisphere, likely indicating petechial hemorrhage. Cavum septum pellucidum et vergae. No midline shift, ventricular entrapment or other mass effect. Vascular: Normal flow voids. Skull and upper cervical spine: Normal marrow signal. Sinuses/Orbits: Negative. Other: None. IMPRESSION: 1. Anterior right MCA territory acute infarct predominantly affecting the posterior and opercular cortices of the frontal lobe. Multiple smaller foci of ischemia in the posterior right hemisphere, still within the MCA territory. 2. Multiple punctate  foci of magnetic susceptibility effect within the right hemisphere, likely indicating petechial hemorrhage. Electronically Signed   By: Ulyses Jarred M.D.   On: 01/21/2020 22:35   MR BRAIN WO CONTRAST  Result Date: 01/21/2020 CLINICAL DATA:  Stroke, follow-up. Additional history provided: 34 year old female with left-sided weakness. EXAM: MRI HEAD WITHOUT CONTRAST TECHNIQUE: Multiplanar, multiecho pulse sequences of the brain and surrounding structures were obtained without intravenous contrast. COMPARISON:  Noncontrast head CT, CT angiogram head/neck and CT perfusion performed earlier the same day 01/21/2020 FINDINGS: Brain: A limited protocol brain MRI was performed at the ordering provider's request. Only axial and coronal diffusion-weighted imaging and an axial T2 FLAIR sequence were obtained. There is cortical/subcortical restricted diffusion involving portions of the posterolateral right frontal lobe and right frontal operculum as well as portions of the right temporoparietal junction within the MCA vascular territory. Findings are consistent with acute ischemic changes. There is only minimal patchy corresponding FLAIR hyperintensity on the current exam. There is no significant mass effect. No midline shift. No restricted diffusion is demonstrated at site of a previously questioned acute infarction within the anterior left frontal lobe. Vascular: Poorly assessed on the acquired sequences. Skull and upper cervical spine: No focal marrow lesion identified on the acquired sequences. Sinuses/Orbits: Unremarkable appearance of the orbits on the acquired sequences. Minimal ethmoid sinus mucosal thickening. No appreciable mastoid effusion. IMPRESSION: Cortical/subcortical restricted diffusion consistent with acute ischemic changes involving portions of the right frontal lobe and right temporoparietal junction within the right MCA vascular territory. There is only minimal patchy corresponding FLAIR hyperintensity  on the current exam. No restricted diffusion is demonstrated at the site of a previously questioned acute anterior left frontal lobe infarct. Electronically Signed   By:  Kellie Simmering DO   On: 01/21/2020 12:04   CT CEREBRAL PERFUSION W CONTRAST  Result Date: 01/21/2020 CLINICAL DATA:  Code stroke. EXAM: CT ANGIOGRAPHY HEAD AND NECK CT PERFUSION BRAIN TECHNIQUE: Multidetector CT imaging of the head and neck was performed using the standard protocol during bolus administration of intravenous contrast. Multiplanar CT image reconstructions and MIPs were obtained to evaluate the vascular anatomy. Carotid stenosis measurements (when applicable) are obtained utilizing NASCET criteria, using the distal internal carotid diameter as the denominator. Multiphase CT imaging of the brain was performed following IV bolus contrast injection. Subsequent parametric perfusion maps were calculated using RAPID software. CONTRAST:  136mL OMNIPAQUE IOHEXOL 350 MG/ML SOLN COMPARISON:  Noncontrast head CT earlier today FINDINGS: CTA NECK FINDINGS Aortic arch: Unremarkable Right carotid system: Vessels are smooth and widely patent. No atheromatous changes Left carotid system: This vessels are smooth and widely patent. No atheromatous changes. Vertebral arteries: No proximal subclavian stenosis. The vertebral arteries are smooth and widely patent. Skeleton: No acute finding Other neck: No acute finding Upper chest: Negative Review of the MIP images confirms the above findings CTA HEAD FINDINGS Anterior circulation: Irregularity along the anterior wall of the right MCA bifurcation with moderate stenosis. There is a right M3 branch occlusion seen subsequently. On MIPS there is operculum ir pruning suggesting additional peripheral emboli. Non enhancement of the right pericallosal artery. Posterior circulation: Head the vertebral and basilar arteries are smooth and widely patent. Fetal type left PCA. The posterior cerebral arteries are smooth  and widely patent. Negative for aneurysm Venous sinuses: Negative Anatomic variants: As above Review of the MIP images confirms the above findings CT Brain Perfusion Findings: ASPECTS: 7 or 8 CBF (<30%) Volume: 20mL-an underestimation Perfusion (Tmax>6.0s) volume: 130 primarily in the upper division right MCA territory but also seen in the distal right ACA territory and to a lesser extent in the left frontal pole. Mismatch Volume: Not applicable Critical Value/emergent results were called by telephone at the time of interpretation on 01/21/2020 at 9:53 am to provider Leonel Ramsay, who verbally acknowledged these results. IMPRESSION: 1. No emergent large vessel occlusion but there is irregular luminal thrombus at the right M1 segment causing moderate stenosis. 2. Right M3 branch occlusion adjacent to an acute infarct by noncontrast CT. Probable right pericallosal embolism. 3. Underestimated core infarct by CT perfusion when compared with aspects. There is a 130 cc territory of delayed/ischemic blood flow mainly in the upper division right MCA territory and right ACA territory. 4. Small area of ischemic blood flow in the left frontal pole where there was suspected cortical infarct by noncontrast CT. 5. No stenosis or embolic source seen in the neck. Electronically Signed   By: Monte Fantasia M.D.   On: 01/21/2020 10:03   ECHOCARDIOGRAM COMPLETE  Result Date: 01/21/2020    ECHOCARDIOGRAM REPORT   Patient Name:   Glenda Hines Date of Exam: 01/21/2020 Medical Rec #:  [id number]       Height:       67.5 in Accession #:    [id number]      Weight:       185.0 lb Date of Birth:  [birth year]       BSA:          1.967 m Patient Age:    33 years        BP:           124/74 mmHg Patient Gender: F  HR:           103 bpm. Exam Location:  Inpatient Procedure: 2D Echo, Cardiac Doppler and Color Doppler Indications:    Stroke 434.91/I163.9  History:        Patient has no prior history of Echocardiogram examinations.  H/o                 SVT.  Sonographer:    Clayton Lefort RDCS (AE) Referring Phys: [phone] MCNEILL P [place]  1. Left ventricular ejection fraction, by estimation, is 60 to 65%. The left ventricle has normal function. The left ventricle has no regional wall motion abnormalities. Left ventricular diastolic parameters were normal.  2. Right ventricular systolic function is normal. The right ventricular size is normal.  3. The mitral valve is normal in structure. Trivial mitral valve regurgitation. No evidence of mitral stenosis.  4. The aortic valve is normal in structure. Aortic valve regurgitation is not visualized. No aortic stenosis is present.  5. The inferior vena cava is normal in size with greater than 50% respiratory variability, suggesting right atrial pressure of 3 mmHg. FINDINGS  Left Ventricle: Left ventricular ejection fraction, by estimation, is 60 to 65%. The left ventricle has normal function. The left ventricle has no regional wall motion abnormalities. The left ventricular internal cavity size was normal in size. There is  no left ventricular hypertrophy. Left ventricular diastolic parameters were normal. Normal left ventricular filling pressure. Right Ventricle: The right ventricular size is normal. No increase in right ventricular wall thickness. Right ventricular systolic function is normal. Left Atrium: Left atrial size was normal in size. Right Atrium: Right atrial size was normal in size. Pericardium: There is no evidence of pericardial effusion. Mitral Valve: The mitral valve is normal in structure. Normal mobility of the mitral valve leaflets. Trivial mitral valve regurgitation. No evidence of mitral valve stenosis. Tricuspid Valve: The tricuspid valve is normal in structure. Tricuspid valve regurgitation is not demonstrated. No evidence of tricuspid stenosis. Aortic Valve: The aortic valve is normal in structure. Aortic valve regurgitation is not visualized. No aortic stenosis is  present. Aortic valve mean gradient measures 5.0 mmHg. Aortic valve peak gradient measures 9.2 mmHg. Aortic valve area, by VTI measures 2.82 cm. Pulmonic Valve: The pulmonic valve was normal in structure. Pulmonic valve regurgitation is not visualized. No evidence of pulmonic stenosis. Aorta: The aortic root is normal in size and structure. Venous: The inferior vena cava is normal in size with greater than 50% respiratory variability, suggesting right atrial pressure of 3 mmHg. IAS/Shunts: No atrial level shunt detected by color flow Doppler.  LEFT VENTRICLE PLAX 2D LVIDd:         4.56 cm LVIDs:         3.58 cm LV PW:         0.99 cm LV IVS:        1.05 cm LVOT diam:     2.20 cm LV SV:         79 LV SV Index:   40 LVOT Area:     3.80 cm  RIGHT VENTRICLE             IVC RV Basal diam:  2.43 cm     IVC diam: 1.06 cm RV S prime:     15.20 cm/s TAPSE (M-mode): 2.3 cm LEFT ATRIUM             Index       RIGHT ATRIUM  Index LA diam:        3.60 cm 1.83 cm/m  RA Area:     12.50 cm LA Vol (A2C):   31.9 ml 16.22 ml/m RA Volume:   27.30 ml  13.88 ml/m LA Vol (A4C):   25.2 ml 12.81 ml/m LA Biplane Vol: 29.8 ml 15.15 ml/m  AORTIC VALVE AV Area (Vmax):    2.85 cm AV Area (Vmean):   2.49 cm AV Area (VTI):     2.82 cm AV Vmax:           152.00 cm/s AV Vmean:          111.000 cm/s AV VTI:            0.280 m AV Peak Grad:      9.2 mmHg AV Mean Grad:      5.0 mmHg LVOT Vmax:         114.00 cm/s LVOT Vmean:        72.800 cm/s LVOT VTI:          0.208 m LVOT/AV VTI ratio: 0.74  AORTA Ao Root diam: 3.10 cm Ao Asc diam:  2.80 cm  SHUNTS Systemic VTI:  0.21 m Systemic Diam: 2.20 cm Dani Gobble Croitoru MD Electronically signed by Sanda Klein MD Signature Date/Time: 01/21/2020/4:23:51 PM    Final    CT HEAD CODE STROKE WO CONTRAST  Result Date: 01/21/2020 CLINICAL DATA:  Code stroke.  Code stroke with left-sided weakness EXAM: CT HEAD WITHOUT CONTRAST TECHNIQUE: Contiguous axial images were obtained from the base of the  skull through the vertex without intravenous contrast. COMPARISON:  None. FINDINGS: Brain: Loss of gray-white differentiation along the lateral right frontal lobe and superficial right temporal lobe. Equivocal low-density at the right putamen. Question small area of gray-white differentiation loss in the high and parasagittal left frontal lobe. No acute hemorrhage, hydrocephalus, or masslike finding. There is a cavum septum pellucidum et vergae Vascular: Hyperdense right MCA at the bifurcation and M2 level Skull: Negative Sinuses/Orbits: Gaze to the right Other: These results were communicated to Dr. Leonel Ramsay at 9:42 amon 4/13/2021by text page via the [facility] messaging system. CTA and perfusion is already pending. ASPECTS [facility] Stroke Program Early CT Score) - Ganglionic level infarction (caudate, lentiform nuclei, internal capsule, insula, M1-M3 cortex): 5/6 - Supraganglionic infarction (M4-M6 cortex): 2 Total score (0-10 with 10 being normal): 7/8 IMPRESSION: 1. Hyperdense right MCA bifurcation.  ASPECTS is 7 or 8. 2. Question small left ACA distribution cortex infarct. Electronically Signed   By: Monte Fantasia M.D.   On: 01/21/2020 09:45    Labs:  CBC: Recent Labs    01/21/20 0930 01/21/20 0938  WBC 5.0  --   HGB 12.9 12.2  HCT 38.5 36.0  PLT 273  --     COAGS: Recent Labs    01/21/20 0930  INR 1.0  APTT 24    BMP: Recent Labs    01/21/20 0930 01/21/20 0938  NA 138 140  K 3.6 3.7  CL 105 105  CO2 23  --   GLUCOSE 154* 148*  BUN 10 11  CALCIUM 8.8*  --   CREATININE 0.84 0.70  GFRNONAA >60  --   GFRAA >60  --     LIVER FUNCTION TESTS: Recent Labs    01/21/20 0930  BILITOT 0.7  AST 17  ALT 15  ALKPHOS 38  PROT 7.0  ALBUMIN 3.6    Assessment and Plan:  History of acute CVA s/p cerebral arteriogram with  emergent mechanical thrombectomy of right MCA M3 anterior division occlusion achieving a TICI 2b revascularization and right pericallosal occlusion achieving  a TICI 3 revascularization via right femoral approach 01/21/2020 by Dr. Karenann Cai. Patient's condition improving- awake and alert, following commands, can spontaneously move all extremities. Right groin incision stable, distal pulses 2+ bilaterally. Further plans per neurology- appreciate and agree with management. Please call NIR with questions/concerns.   Electronically Signed: Earley Abide, PA-C 01/22/2020, 9:57 AM   I spent a total of 25 Minutes at the the patient's bedside AND on the patient's hospital floor or unit, greater than 50% of which was counseling/coordinating care for CVA s/p revascularization.

## 2020-01-22 NOTE — Social Work (Signed)
CSW met with pt at bedside. CSW introduced self and explained her role. CSW completed sbirt with pt.  Pt scored a 0 on the sbirt scale. Pt denied alcohol use. Pt denied substance use. Pt did not need resources at this time.  Eunique Balik, LCSWA, LCASA Clinical Social Worker [phone]  

## 2020-01-22 NOTE — Progress Notes (Signed)
    [facility] has been requested to perform a transesophageal echocardiogram on Glenda Hines for cryptogenic stroke.  After careful review of history and examination, the risks and benefits of transesophageal echocardiogram have been explained including risks of esophageal damage, perforation (1:10,000 risk), bleeding, pharyngeal hematoma as well as other potential complications associated with conscious sedation including aspiration, arrhythmia, respiratory failure and death. Alternatives to treatment were discussed, questions were answered. Patient is willing to proceed.   Baldwin Jamaica, PA-C  01/22/2020 4:49 PM

## 2020-01-22 NOTE — Progress Notes (Signed)
Lower venous duplex and TCD bubble study       have been completed. Preliminary results can be found under CV proc through chart review. June Leap, BS, RDMS, RVT

## 2020-01-22 NOTE — Consult Note (Addendum)
Cardiology Consultation:   Patient ID: Glenda Hines MRN: [id number]; DOB: 01-Nov-1985  Admit date: 01/21/2020 Date of Consult: 01/22/2020  Primary Care Provider: No primary care provider on file. Primary Cardiologist: No primary care provider on file. Primary Electrophysiologist:  None    Patient Profile:   Glenda Hines is a 34 y.o. female with h/o SVT ablated 2013, migraines, 3 miscarriages, no DVT hx, recently on OBC, no other significant PMHx who is being seen today for the evaluation of monitoring recommendations s/p stroke at the request of Dr. Leonie Hines.  EP history Saw Dr. Fatima Hines, note reviewed 04/21/2014 History of Successful long RP tachycardia at the [facility] 1. 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 Postural orthostatic tachycardia  She was minimally symptomatic with some resting tachycardia, no need for further EP follow up  History of Present Illness:   Glenda Hines upon waking the date of her admission , not feeling well, husband noted left facial droop and she was unable to stand. EMS was called, she was found with an embolic stroke and brought emergently to IR  Neurology noted Scattered RMCA infarcts embolic etiology cryptogenic TCD noted likely small PFO, she is planned for TEE tomorrow  Neurology has asked EP to see her today to discuss monitoring recommendations with reports of palpitations prior to her stroke.  Past Medical History:  Diagnosis Date  . Pituitary tumor   . SVT (supraventricular tachycardia) (HCC)     Past Surgical History:  Procedure Laterality Date  . catheter ablation    . IR CT HEAD LTD  01/21/2020  . IR PERCUTANEOUS ART THROMBECTOMY/INFUSION INTRACRANIAL INC DIAG ANGIO  01/21/2020  . KNEE SURGERY    . RADIOLOGY WITH ANESTHESIA N/A 01/21/2020   Procedure: RADIOLOGY WITH ANESTHESIA;  Surgeon: Radiologist, Medication, MD;  Location: [place];  Service: Radiology;  Laterality: N/A;  .  TONSILLECTOMY       Home Medications:  Prior to Admission medications   Medication Sig Start Date End Date Taking? Authorizing Provider  Multiple Vitamin (MULTIVITAMIN ADULT PO) Take 1 tablet by mouth daily.   Yes [provider]  norethindrone-ethinyl estradiol (LOESTRIN) 1-20 MG-MCG tablet Take 1 tablet by mouth daily. 11/12/19  Yes [provider]  Vitamin D-Vitamin K (VITAMIN K2-VITAMIN D3 PO) Take 1 tablet by mouth daily. "Health As It Ought To Be" 7500 iu, 220mg   Yes [provider]    Inpatient Medications: Scheduled Meds: .  stroke: mapping our early stages of recovery book   Does not apply Once  . aspirin EC  81 mg Oral Daily  . atorvastatin  40 mg Oral Daily  . Chlorhexidine Gluconate Cloth  6 each Topical Daily  . clopidogrel  75 mg Oral Daily  . enoxaparin (LOVENOX) injection  40 mg Subcutaneous Q24H  . sodium chloride flush  3 mL Intravenous Once   Continuous Infusions: . sodium chloride Stopped (01/21/20 2116)  . niCARDipine     PRN Meds: acetaminophen **OR** acetaminophen (TYLENOL) oral liquid 160 mg/5 mL **OR** acetaminophen  Allergies:    Allergies  Allergen Reactions  . Oxycodone Shortness Of Breath  . Vancomycin Rash  . Squid Oil Nausea And Vomiting    Squid   . Sulfa Antibiotics Rash  . Tape Rash    Social History:   Social History   Socioeconomic History  . Marital status: Married    Spouse name: Not on file  . Number of children: Not on file  . Years  of education: Not on file  . Highest education level: Not on file  Occupational History  . Not on file  Tobacco Use  . Smoking status: Not on file  Substance and Sexual Activity  . Alcohol use: No  . Drug use: No  . Sexual activity: Never  Other Topics Concern  . Not on file  Social History Narrative  . Not on file   Social Determinants of Health   Financial Resource Strain:   . Difficulty of Paying Living Expenses:   Food Insecurity:   . Worried About  Charity fundraiser in the Last Year:   . Arboriculturist in the Last Year:   Transportation Needs:   . Film/video editor (Medical):   Marland Kitchen Lack of Transportation (Non-Medical):   Physical Activity:   . Days of Exercise per Week:   . Minutes of Exercise per Session:   Stress:   . Feeling of Stress :   Social Connections:   . Frequency of Communication with Friends and Family:   . Frequency of Social Gatherings with Friends and Family:   . Attends Religious Services:   . Active Member of Clubs or Organizations:   . Attends Archivist Meetings:   Marland Kitchen Marital Status:   Intimate Partner Violence:   . Fear of Current or Ex-Partner:   . Emotionally Abused:   Marland Kitchen Physically Abused:   . Sexually Abused:     Family History:   Family History  Problem Relation Age of Onset  . Hypertension Mother   . Diabetes Father      ROS:  Please see the history of present illness.  All other ROS reviewed and negative.     Physical Exam/Data:   Vitals:   01/22/20 1400 01/22/20 1500 01/22/20 1600 01/22/20 1700  BP: 106/69 122/74 117/77 (!) 114/55  Pulse:   82 83  Resp: 20 19 20 18   Temp:   99.5 F (37.5 C) 98.5 F (36.9 C)  TempSrc:   Oral Oral  SpO2:   98% 99%  Weight:      Height:        Intake/Output Summary (Last 24 hours) at 01/22/2020 1738 Last data filed at 01/22/2020 1100 Gross per 24 hour  Intake 796.22 ml  Output 1700 ml  Net -903.78 ml   Last 3 Weights 01/21/2020 09/09/2012  Weight (lbs) 184 lb 15.5 oz 155 lb 6.4 oz  Weight (kg) 83.9 kg 70.489 kg     Body mass index is 28.54 kg/m.  General:  Well nourished, well developed, in no acute distress HEENT: normal Lymph: no adenopathy Neck: no JVD Endocrine:  No thryomegaly Vascular: No carotid bruits  Cardiac:   RRR; no murmurs, gallops or rubs Lungs: CTA b/l, no wheezing, rhonchi or rales  Abd: soft, nontender, no hepatomegaly  Ext: no edema Musculoskeletal:  No deformities, Skin: warm and dry  Neuro:  L  facial droop, full neuro exam no performed Psych:  Normal affect   EKG:  The EKG was personally reviewed and demonstrates:    No EKGs in Epic, care everywhere is reviewed, I was unable to get report or images of her EKGs to review  Telemetry:  Telemetry was personally reviewed and demonstrates:   SR, some AT 110's  Relevant CV Studies:  01/21/2020: TTE IMPRESSIONS   1. Left ventricular ejection fraction, by estimation, is 60 to 65%. The  left ventricle has normal function. The left ventricle has no regional  wall motion  abnormalities. Left ventricular diastolic parameters were  normal.   2. Right ventricular systolic function is normal. The right ventricular  size is normal.   3. The mitral valve is normal in structure. Trivial mitral valve  regurgitation. No evidence of mitral stenosis.   4. The aortic valve is normal in structure. Aortic valve regurgitation is  not visualized. No aortic stenosis is present.   5. The inferior vena cava is normal in size with greater than 50%  respiratory variability, suggesting right atrial pressure of 3 mmHg.   Laboratory Data:  High Sensitivity Troponin:  No results for input(s): TROPONINIHS in the last 720 hours.   Chemistry Recent Labs  Lab 01/21/20 0930 01/21/20 0938  NA 138 140  K 3.6 3.7  CL 105 105  CO2 23  --   GLUCOSE 154* 148*  BUN 10 11  CREATININE 0.84 0.70  CALCIUM 8.8*  --   GFRNONAA >60  --   GFRAA >60  --   ANIONGAP 10  --     Recent Labs  Lab 01/21/20 0930  PROT 7.0  ALBUMIN 3.6  AST 17  ALT 15  ALKPHOS 38  BILITOT 0.7   Hematology Recent Labs  Lab 01/21/20 0930 01/21/20 0938  WBC 5.0  --   RBC 4.36  --   HGB 12.9 12.2  HCT 38.5 36.0  MCV 88.3  --   MCH 29.6  --   MCHC 33.5  --   RDW 12.4  --   PLT 273  --    BNPNo results for input(s): BNP, PROBNP in the last 168 hours.  DDimer No results for input(s): DDIMER in the last 168 hours.   Radiology/Studies:  CT ANGIO HEAD W OR WO  CONTRAST Result Date: 01/21/2020 CLINICAL DATA:  Code stroke. EXAM: CT ANGIOGRAPHY HEAD AND NECK CT PERFUSION BRAIN TECHNIQUE: Multidetector CT imaging of the head and neck was performed using the standard protocol during bolus administration of intravenous contrast. Multiplanar CT image reconstructions and MIPs were obtained to evaluate the vascular anatomy. Carotid stenosis measurements (when applicable) are obtained utilizing NASCET criteria, using the distal internal carotid diameter as the denominator. Multiphase CT imaging of the brain was performed following IV bolus contrast injection. Subsequent parametric perfusion maps were calculated using RAPID software. CONTRAST:  169m OMNIPAQUE IOHEXOL 350 MG/ML SOLN COMPARISON:  Noncontrast head CT earlier today FINDINGS: CTA NECK FINDINGS Aortic arch: Unremarkable Right carotid system: Vessels are smooth and widely patent. No atheromatous changes Left carotid system: This vessels are smooth and widely patent. No atheromatous changes. Vertebral arteries: No proximal subclavian stenosis. The vertebral arteries are smooth and widely patent. Skeleton: No acute finding Other neck: No acute finding Upper chest: Negative Review of the MIP images confirms the above findings CTA HEAD FINDINGS Anterior circulation: Irregularity along the anterior wall of the right MCA bifurcation with moderate stenosis. There is a right M3 branch occlusion seen subsequently. On MIPS there is operculum ir pruning suggesting additional peripheral emboli. Non enhancement of the right pericallosal artery. Posterior circulation: Head the vertebral and basilar arteries are smooth and widely patent. Fetal type left PCA. The posterior cerebral arteries are smooth and widely patent. Negative for aneurysm Venous sinuses: Negative Anatomic variants: As above Review of the MIP images confirms the above findings CT Brain Perfusion Findings: ASPECTS: 7 or 8 CBF (<30%) Volume: 058man underestimation  Perfusion (Tmax>6.0s) volume: 130 primarily in the upper division right MCA territory but also seen in the distal right ACA territory and  to a lesser extent in the left frontal pole. Mismatch Volume: Not applicable Critical Value/emergent results were called by telephone at the time of interpretation on 01/21/2020 at 9:53 am to provider Leonel Ramsay, who verbally acknowledged these results. IMPRESSION: 1. No emergent large vessel occlusion but there is irregular luminal thrombus at the right M1 segment causing moderate stenosis. 2. Right M3 branch occlusion adjacent to an acute infarct by noncontrast CT. Probable right pericallosal embolism. 3. Underestimated core infarct by CT perfusion when compared with aspects. There is a 130 cc territory of delayed/ischemic blood flow mainly in the upper division right MCA territory and right ACA territory. 4. Small area of ischemic blood flow in the left frontal pole where there was suspected cortical infarct by noncontrast CT. 5. No stenosis or embolic source seen in the neck. Electronically Signed   By: Monte Fantasia M.D.   On: 01/21/2020 10:03      MR BRAIN WO CONTRAST Result Date: 01/21/2020 CLINICAL DATA:  Right-sided gaze with left-sided weakness EXAM: MRI HEAD WITHOUT CONTRAST TECHNIQUE: Multiplanar, multiecho pulse sequences of the brain and surrounding structures were obtained without intravenous contrast. COMPARISON:  Head CT 01/21/2020 FINDINGS: Brain: Anterior right MCA territory acute cortical infarct predominantly affecting the posterior right frontal cortex and operculum. There is also acute ischemia the right basal ganglia. Multiple smaller foci of ischemia in the posterior right hemisphere, still within the MCA territory. No acute ischemia within any other vascular territory. Normal white matter signal. Normal volume of CSF spaces. Multiple punctate foci of magnetic susceptibility effect within the right hemisphere, likely indicating petechial hemorrhage.  Cavum septum pellucidum et vergae. No midline shift, ventricular entrapment or other mass effect. Vascular: Normal flow voids. Skull and upper cervical spine: Normal marrow signal. Sinuses/Orbits: Negative. Other: None. IMPRESSION: 1. Anterior right MCA territory acute infarct predominantly affecting the posterior and opercular cortices of the frontal lobe. Multiple smaller foci of ischemia in the posterior right hemisphere, still within the MCA territory. 2. Multiple punctate foci of magnetic susceptibility effect within the right hemisphere, likely indicating petechial hemorrhage. Electronically Signed   By: Ulyses Jarred M.D.   On: 01/21/2020 22:35     IR CT Head Ltd Result Date: 01/22/2020 INDICATION: 34 year old female with past medical history is pituitary tumor, supraventricular tachycardia and prior miscarriages. She presented to ED the with right-sided gaze preference, left sided neglect and left-sided facial droop. NIHSS 10. Premorbid Rankin scale 0. Last seen well 22:00 on 01/20/2020. No intravenous tPA as she was outside the window. Head CT showed loss of gray-white differentiation the right frontal and anterior temporal lobes as well as right putamen with hyperdense right MCA bifurcation. CT angiogram showed subocclusive clot in the distal right M1 extending to the origin of the right M2/superior division branch, occlusion of a right M3/MCA middle division branch and right pericallosal artery distal to the origin of the callosomarginal artery. CT perfusion showed no evidence of core infarct with a a number of of 130 mL. Given discordance between noncontrast head CT and CT perfusion, an MRI of the brain was obtained an showed a posterior right frontal and small parietal areas of restricted diffusion, predominantly cortical/subcortical with minimal corresponding increased signal on FLAIR. There was slow flow in cortical right MCA and ACA branches. Decision was made to proceed with mechanical  thrombectomy. EXAM: Diagnostic cerebral angiogram. Mechanical thrombectomy. Flat panel head CT. COMPARISON:  CT/CT angiogram of the head and neck January 21, 2020. MEDICATIONS: 15 mg of verapamil intra arterial. ANESTHESIA/SEDATION:  The procedure was performed under general anesthesia. CONTRAST:  70 mL Omnipaque 240 FLUOROSCOPY TIME:  Fluoroscopy Time: 64 minutes 42 seconds (1,295 mGy). COMPLICATIONS: SIR Level A - No therapy, no consequence. TECHNIQUE: Informed written consent was obtained from the the patient and her mother after a thorough discussion of the procedural risks, benefits and alternatives. All questions were addressed. Maximal Sterile Barrier Technique was utilized including caps, mask, sterile gowns, sterile gloves, sterile drape, hand hygiene and skin antiseptic. A timeout was performed prior to the initiation of the procedure. Using a micropuncture kit and the modified Seldinger technique, access was gained to the right common femoral artery and an 8 French sheath was placed. Under fluoroscopy, an 8 French walrus balloon guide catheter was navigated over a 6 [place] Berenstein 2 catheter and a 0.035 inch Terumo Glidewire into the aortic arch. The catheter was placed in the right common carotid artery and then advanced into the right internal carotid artery. Frontal and lateral angiograms of the head were obtained. FINDINGS: 1. Subocclusive clot in the distal M1 segment of the right MCA described on prior CT angiogram is no longer seen. 2. Interval migration of the right M2/MCA superior division clot to an M3 segment. 3. Persistent occlusion of a right M3/MCA middle division branch. 4. Occlusion of 2 M4/MCA posterior division branches. 5. Persistent occlusion of the right pericallosal artery just distal to the origin of the callosomarginal artery. PROCEDURE: Under biplane roadmap, a Catalyst 6 aspiration catheter was navigated over a phenom 21 microcatheter and a synchro support microguidewire into the  cavernous segment of the right ICA. The microcatheter was then navigated over the wire into the right M3/MCA middle division branch. Then, a 3 mm solitaire stent retriever was deployed spanning the distal M2 and proximal M3/MCA middle division branch. The device was allowed to intercalated with the clot for 4 minutes. The aspiration catheter was advanced to the M1 segment and connected to a penumbra aspiration pump. The guiding catheter balloon was inflated. The thrombectomy device and aspiration catheter were removed under constant aspiration. Right ICA angiograms were obtained showing persistent right M3/MCA middle division branch occlusion. Vasa spasm at the tip of the balloon guide catheter in the right ICA as well as in the M1 through M3 segments was noted. Intra arterial infusion of 5 mg of verapamil was performed over 10 minutes for device induced vasospasm treatment. Right ICA angiograms with frontal and lateral views of the head were obtained in showed improvement of the right ICA and MCA vasospasm. Under biplane roadmap, a Catalyst 6 aspiration catheter was navigated over a phenom 21 microcatheter and a synchro support microguidewire into the cavernous segment of the right ICA. The microcatheter was then navigated over the wire into the right MCA/M3 anterior division branch. Resistance advancing the catheter over the wire across the occlusion point with stretching of the vessels was noted. Attempt was aborted. Right ICA angiograms with frontal and lateral views of the head were obtained in showed no evidence of active contrast extravasation. Under biplane roadmap, a 3Max aspiration catheter was navigated over an Aristotle 14 micro guidewire into the right ACA/A3 segment, at the level of occlusion. Continuous aspiration was applied for 4 minutes. The catheter was then slowly retracted under continuous aspiration. Follow-up right ICA angiograms showed complete pericallosal artery recanalization. Flat panel  CT of the head was obtained and post processed in a separate workstation with concurrent attending physician supervision. Selected images were sent to PACS. Small focus of subarachnoid hemorrhage was  noted in the anterior right frontal region. Under biplane roadmap, a 3Max aspiration catheter was navigated over an Aristotle 14 micro guidewire into theright M3/MCA middle division branch, at the level of occlusion. Continuous aspiration was applied for 4 minutes. The catheter was then slowly retracted under continuous aspiration. Follow-up right ICA angiograms were obtained showing persistent occlusion of the right M3/MCA middle division branch. The guiding catheter was retracted to the level of the right common carotid artery and a fluoroscopy loop was obtained showing moderate vasospasm at the upper cervical segment of the right ICA. Intra arterial infusion of 10 mg of verapamil was performed over 10 minutes for device induced vasospasm treatment. Subsequent fluoroscopy loop showed resolution of the vasospasm. Angiogram was obtained via femoral sheath side port showing puncture at the level of the common femoral artery which has normal caliber. The sheath was exchanged over the wire for a Perclose ProGlide which was used for access closure. Immediate hemostasis was achieved IMPRESSION: 1. Interval fragmentation of the subocclusive right M1/MCA thrombus and distal migration of the right M2/MCA anterior division branch clot into and M3 segment. Initial TICI score was 2B and remained the same despite efforts to recanalize the anterior and medial M3 branches. 2. Persistent occlusion of the right pericallosal artery successfully recanalized with contact aspiration. Initial TICI score was 2A and final TICI score was 3. 3. Small focal subarachnoid hemorrhage the right anterior frontal region. PLAN: Patient was transferred to ICU for continued monitoring. Stroke workup per neurology team. Electronically Signed   By:  Pedro Earls M.D.   On: 01/22/2020 14:18    VAS [place] TRANSCRANIAL DOPPLER W BUBBLES Result Date: 01/22/2020  Transcranial Doppler with Bubble Indications: Stroke. Performing Technologist: June Leap RDMS, RVT  Examination Guidelines: A complete evaluation includes B-mode imaging, spectral Doppler, color Doppler, and power Doppler as needed of all accessible portions of each vessel. Bilateral testing is considered an integral part of a complete examination. Limited examinations for reoccurring indications may be performed as noted.  Summary:  A vascular evaluation was performed. The right middle cerebral artery was studied. An IV was inserted into the patient's left hand. Verbal informed consent was obtained.  Trivial HITS at rest. Mild HITS during Valsalva. Probable small PFO. *See table(s) above for TCD measurements and observations.    Preliminary      CT HEAD CODE STROKE WO CONTRAST Result Date: 01/21/2020 CLINICAL DATA:  Code stroke.  Code stroke with left-sided weakness EXAM: CT HEAD WITHOUT CONTRAST TECHNIQUE: Contiguous axial images were obtained from the base of the skull through the vertex without intravenous contrast. COMPARISON:  None. FINDINGS: Brain: Loss of gray-white differentiation along the lateral right frontal lobe and superficial right temporal lobe. Equivocal low-density at the right putamen. Question small area of gray-white differentiation loss in the high and parasagittal left frontal lobe. No acute hemorrhage, hydrocephalus, or masslike finding. There is a cavum septum pellucidum et vergae Vascular: Hyperdense right MCA at the bifurcation and M2 level Skull: Negative Sinuses/Orbits: Gaze to the right Other: These results were communicated to Dr. Leonel Ramsay at 9:42 amon 4/13/2021by text page via the [facility] messaging system. CTA and perfusion is already pending. ASPECTS [facility] Stroke Program Early CT Score) - Ganglionic level infarction (caudate, lentiform nuclei,  internal capsule, insula, M1-M3 cortex): 5/6 - Supraganglionic infarction (M4-M6 cortex): 2 Total score (0-10 with 10 being normal): 7/8 IMPRESSION: 1. Hyperdense right MCA bifurcation.  ASPECTS is 7 or 8. 2. Question small left ACA distribution cortex infarct.  Electronically Signed   By: Monte Fantasia M.D.   On: 01/21/2020 09:45     VAS [place] LOWER EXTREMITY VENOUS (DVT) Result Date: 01/22/2020  Lower Venous DVTStudy Indications: Stroke, and PFO.  Comparison Study: no prior Performing Technologist: June Leap RDMS, RVT  Examination Guidelines: A complete evaluation includes B-mode imaging, spectral Doppler, color Doppler, and power Doppler as needed of all accessible portions of each vessel. Bilateral testing is considered an integral part of a complete examination. Limited examinations for reoccurring indications may be performed as noted. The reflux portion of the exam is performed with the patient in reverse Trendelenburg.   Summary: RIGHT: - There is no evidence of deep vein thrombosis in the lower extremity.  - No cystic structure found in the popliteal fossa.  LEFT: - There is no evidence of deep vein thrombosis in the lower extremity.  - No cystic structure found in the popliteal fossa.  *See table(s) above for measurements and observations.    Preliminary    { Assessment and Plan:   1. Cryptogenic stroke  She has some palpitations few and far between, these are a skip or extra beat.  The weekend prior to her stroke she had some awareness of these more so.  She has not had any persistent palpitations, no symptoms of her SVT post ablation She has been told of PACs and some PVCs by remote monitoring Never any AFib   She has TEE planned for tomorrow She was on OBC, stopped 5-6 days ago No DVT, small PFO by TCD study  She carries a diagnosis of POTS though she reports there has been disagreement between her prior cardiologists about this one saying no and another saying yes, She is not  inclined to want to consider a loop implant, though would be ok with wearable monitor  Dr. Curt Bears Nadiyah Zeis see later today           For questions or updates, please contact [facility] Please consult [email or web address] for contact info under     Signed, Jasey Cortez Meredith Leeds, MD  01/22/2020 5:38 PM  I have seen and examined this patient with Tommye Standard.  Agree with above, note added to reflect my findings.  On exam, RRR, no murmurs, lungs clear.  Patient presented to the hospital with facial droop and left-sided weakness.  Imaging showed cryptogenic stroke due to likely embolic phenomenon.  She had a thrombectomy performed.  She does have residual facial droop, though she is improving.  She had a TTE that showed a PFO with a plan for TEE tomorrow to further evaluate.  She does have a history of SVT, PACs, and PVCs but no known atrial fibrillation.  I do feel that further monitoring is warranted.  We Kailey Esquilin fit her with a 14-day monitor.  If this does not show atrial fibrillation, we Kelita Wallis bring her back to clinic for further discussion of Linq monitor. In the interim, agree with hypercoagulable workup. Would avoid hormone therapy for birth control during the hypercoagulable workup.  Malasia Torain M. Oden Lindaman MD 01/22/2020 5:38 PM

## 2020-01-23 ENCOUNTER — Ambulatory Visit (INDEPENDENT_AMBULATORY_CARE_PROVIDER_SITE_OTHER): Payer: BC Managed Care – PPO

## 2020-01-23 ENCOUNTER — Encounter (HOSPITAL_COMMUNITY): Payer: Self-pay | Admitting: Neurology

## 2020-01-23 ENCOUNTER — Encounter (HOSPITAL_COMMUNITY)
Admission: EM | Disposition: A | Discharge status: Home / Self Care | Payer: Self-pay | Source: Home / Self Care | Attending: Neurology

## 2020-01-23 ENCOUNTER — Inpatient Hospital Stay (HOSPITAL_COMMUNITY): Payer: BC Managed Care – PPO | Admitting: Certified Registered Nurse Anesthetist

## 2020-01-23 ENCOUNTER — Inpatient Hospital Stay (HOSPITAL_COMMUNITY): Payer: BC Managed Care – PPO

## 2020-01-23 DIAGNOSIS — R9431 Abnormal electrocardiogram [ECG] [EKG]: Secondary | ICD-10-CM

## 2020-01-23 DIAGNOSIS — Q2112 Patent foramen ovale: Secondary | ICD-10-CM

## 2020-01-23 DIAGNOSIS — I639 Cerebral infarction, unspecified: Secondary | ICD-10-CM | POA: Diagnosis not present

## 2020-01-23 DIAGNOSIS — I63411 Cerebral infarction due to embolism of right middle cerebral artery: Principal | ICD-10-CM

## 2020-01-23 DIAGNOSIS — Q211 Atrial septal defect: Secondary | ICD-10-CM

## 2020-01-23 DIAGNOSIS — R531 Weakness: Secondary | ICD-10-CM

## 2020-01-23 DIAGNOSIS — I340 Nonrheumatic mitral (valve) insufficiency: Secondary | ICD-10-CM | POA: Diagnosis not present

## 2020-01-23 HISTORY — PX: BUBBLE STUDY: SHX6837

## 2020-01-23 HISTORY — PX: TEE WITHOUT CARDIOVERSION: SHX5443

## 2020-01-23 LAB — PROTEIN C ACTIVITY: Protein C Activity: 168 % (ref 73–180)

## 2020-01-23 LAB — PROTEIN S ACTIVITY: Protein S Activity: 65 % (ref 63–140)

## 2020-01-23 LAB — HEMOGLOBIN A1C
Hgb A1c MFr Bld: 5.1 % (ref 4.8–5.6)
Mean Plasma Glucose: 100 mg/dL

## 2020-01-23 LAB — LUPUS ANTICOAGULANT PANEL
DRVVT: 31.8 s (ref 0.0–47.0)
PTT Lupus Anticoagulant: 28.3 s (ref 0.0–51.9)

## 2020-01-23 LAB — HOMOCYSTEINE: Homocysteine: 6.7 umol/L (ref 0.0–14.5)

## 2020-01-23 LAB — ANA W/REFLEX IF POSITIVE: Anti Nuclear Antibody (ANA): NEGATIVE

## 2020-01-23 LAB — PROTEIN C, TOTAL: Protein C, Total: 153 % — ABNORMAL HIGH (ref 60–150)

## 2020-01-23 LAB — PROTEIN S, TOTAL: Protein S Ag, Total: 72 % (ref 60–150)

## 2020-01-23 SURGERY — ECHOCARDIOGRAM, TRANSESOPHAGEAL
Anesthesia: Monitor Anesthesia Care

## 2020-01-23 MED ORDER — STROKE: EARLY STAGES OF RECOVERY BOOK: 1.0000 | Freq: Once | Status: AC

## 2020-01-23 MED ORDER — ATORVASTATIN CALCIUM 40 MG PO TABS: 40.0000 mg | ORAL_TABLET | Freq: Every day | ORAL | 1 refills | Status: DC

## 2020-01-23 MED ORDER — PROPOFOL 10 MG/ML IV BOLUS
INTRAVENOUS | Status: DC | PRN
  Administered 2020-01-23: 10 mg via INTRAVENOUS
  Administered 2020-01-23 (×2): 20 mg via INTRAVENOUS

## 2020-01-23 MED ORDER — CLOPIDOGREL BISULFATE 75 MG PO TABS: 75.0000 mg | ORAL_TABLET | Freq: Every day | ORAL | 0 refills | Status: DC

## 2020-01-23 MED ORDER — BUTAMBEN-TETRACAINE-BENZOCAINE 2-2-14 % EX AERO
INHALATION_SPRAY | CUTANEOUS | Status: DC | PRN
  Administered 2020-01-23: 1 via TOPICAL

## 2020-01-23 MED ORDER — ASPIRIN 81 MG PO TBEC: 81.0000 mg | DELAYED_RELEASE_TABLET | Freq: Every day | ORAL | Status: AC

## 2020-01-23 MED ORDER — LIDOCAINE 2% (20 MG/ML) 5 ML SYRINGE
INTRAMUSCULAR | Status: DC | PRN
  Administered 2020-01-23: 60 mg via INTRAVENOUS

## 2020-01-23 MED ORDER — PROPOFOL 500 MG/50ML IV EMUL
INTRAVENOUS | Status: DC | PRN
  Administered 2020-01-23: 150 ug/kg/min via INTRAVENOUS

## 2020-01-23 NOTE — TOC Transition Note (Addendum)
Transition of Care [facility]) - CM/SW Discharge Note   Patient Details  Name: Glenda Hines MRN: [id number] Date of Birth: November 28, 1985  [facility] CM/SW Contact:  Pollie Friar, RN Phone Number: 01/23/2020, 10:33 AM   Clinical Narrative:    PCP: Dr Truman Hayward in [place] Alert Pt with recommendations for outpatient therapy. CM met with the patient and her spouse and they would like to attend [facility]. Orders placed in Epic and information on the AVS.  Pt has needed transportation and denies any issues with medications at home.  No DME needs.  Spouse to provide transport home.    Final next level of care: OP Rehab Barriers to Discharge: No Barriers Identified   Patient Goals and CMS Choice     Choice offered to / list presented to : Patient, Spouse  Discharge Placement                       Discharge Plan and Services                                     Social Determinants of Health (SDOH) Interventions     Readmission Risk Interventions No flowsheet data found.

## 2020-01-23 NOTE — Transfer of Care (Signed)
Immediate Anesthesia Transfer of Care Note  Patient: Glenda Hines  Procedure(s) Performed: TRANSESOPHAGEAL ECHOCARDIOGRAM (TEE) (N/A ) BUBBLE STUDY  Patient Location: PACU  Anesthesia Type:MAC  Level of Consciousness: awake  Airway & Oxygen Therapy: Patient Spontanous Breathing  Post-op Assessment: Report given to RN  Post vital signs: Reviewed and stable  Last Vitals:  Vitals Value Taken Time  BP 103/40 01/23/20 1142  Temp    Pulse 82 01/23/20 1143  Resp 25 01/23/20 1143  SpO2 98 % 01/23/20 1143  Vitals shown include unvalidated device data.  Last Pain:  Vitals:   01/23/20 1108  TempSrc: Temporal  PainSc: 5          Complications: No apparent anesthesia complications

## 2020-01-23 NOTE — Op Note (Signed)
TEE: Anesthesia:  Propofol  Normal LV EF 60% No LAA thrombus Trivial MR Normal AV/TV/PV Small PFO seen by 2D/color flow and positive bubble study correlates with findings On TCD No effusion  Normal aortic root No aortic debris  See full report in [employer]  Jenkins Rouge MD [facility]

## 2020-01-23 NOTE — Anesthesia Postprocedure Evaluation (Signed)
Anesthesia Post Note  Patient: Glenda Hines  Procedure(s) Performed: TRANSESOPHAGEAL ECHOCARDIOGRAM (TEE) (N/A ) BUBBLE STUDY     Patient location during evaluation: PACU Anesthesia Type: MAC Level of consciousness: awake and alert Pain management: pain level controlled Vital Signs Assessment: post-procedure vital signs reviewed and stable Respiratory status: spontaneous breathing, nonlabored ventilation, respiratory function stable and patient connected to nasal cannula oxygen Cardiovascular status: stable and blood pressure returned to baseline Postop Assessment: no apparent nausea or vomiting Anesthetic complications: no    Last Vitals:  Vitals:   01/23/20 1205 01/23/20 1210  BP: (!) 101/52 122/63  Pulse: (!) 101 83  Resp: (!) 21 13  Temp:    SpO2: 98% 96%    Last Pain:  Vitals:   01/23/20 1142  TempSrc:   PainSc: 0-No pain                 [name]

## 2020-01-23 NOTE — Progress Notes (Signed)
Physical Therapy Treatment Patient Details Name: Glenda Hines MRN: [id number] DOB: [birth year] Today's Date: 01/23/2020    History of Present Illness 34 y.o. female admitted on 01/21/20 for L sided weakness/facial droop.  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  Pt with significant PMH of SVT, pituitary tumor, multiple R knee surgeries.      PT Comments    Patient seen for mobility progression. Current plan remains appropriate.    Follow Up Recommendations  Outpatient PT     Equipment Recommendations  None recommended by PT    Recommendations for Other Services       Precautions / Restrictions Precautions Precaution Comments: mild left inattention Restrictions Weight Bearing Restrictions: No    Mobility  Bed Mobility Overal bed mobility: Independent                Transfers Overall transfer level: Independent Equipment used: None Transfers: Sit to/from Stand              Ambulation/Gait Ambulation/Gait assistance: Scientist, forensic (Feet): 400 Feet Assistive device: None Gait Pattern/deviations: Step-through pattern;Decreased stride length Gait velocity: decreased   General Gait Details: decreased cadence and guarded movments; demonstrates awareness to L side with cues    Stairs             Wheelchair Mobility    Modified Rankin (Stroke Patients Only) Modified Rankin (Stroke Patients Only) Pre-Morbid Rankin Score: No symptoms Modified Rankin: Moderately severe disability     Balance Overall balance assessment: Needs assistance Sitting-balance support: Feet supported;No upper extremity supported Sitting balance-Leahy Scale: Good     Standing balance support: No upper extremity supported Standing balance-Leahy Scale: Good               High level balance activites: Side stepping;Backward  walking;Direction changes;Sudden stops;Head turns High Level Balance Comments: able to maintain balance with rhomberg eyes closed; unable to tandem stance leg leg Standardized Balance Assessment Standardized Balance Assessment : Dynamic Gait Index   Dynamic Gait Index Level Surface: Normal Change in Gait Speed: Normal Gait with Horizontal Head Turns: Mild Impairment Gait with Vertical Head Turns: Normal Gait and Pivot Turn: Normal Step Over Obstacle: Normal Step Around Obstacles: Mild Impairment Steps: Normal Total Score: 22      Cognition Arousal/Alertness: Awake/alert Behavior During Therapy: Flat affect Overall Cognitive Status: Impaired/Different from baseline Area of Impairment: Awareness;Problem solving                           Awareness: Emergent Problem Solving: Slow processing        Exercises Other Exercises Other Exercises: reaching activity: able to pick up items from floor     General Comments        Pertinent Vitals/Pain Pain Assessment: Faces Faces Pain Scale: Hurts a little bit Pain Location: headache Pain Descriptors / Indicators: Aching Pain Intervention(s): Monitored during session;Limited activity within patient's tolerance    Home Living                      Prior Function            PT Goals (current goals can now be found in the care plan section) Progress towards PT goals: Progressing toward goals    Frequency    Min 4X/week      PT Plan Current plan  remains appropriate    Co-evaluation              AM-PAC PT "6 Clicks" Mobility   Outcome Measure  Help needed turning from your back to your side while in a flat bed without using bedrails?: None Help needed moving from lying on your back to sitting on the side of a flat bed without using bedrails?: None Help needed moving to and from a bed to a chair (including a wheelchair)?: None Help needed standing up from a chair using your arms (e.g.,  wheelchair or bedside chair)?: None Help needed to walk in hospital room?: None Help needed climbing 3-5 steps with a railing? : None 6 Click Score: 24    End of Session   Activity Tolerance: Patient tolerated treatment well Patient left: with call bell/phone within reach;with family/visitor present;in bed Nurse Communication: Mobility status PT Visit Diagnosis: Muscle weakness (generalized) (M62.81);Difficulty in walking, not elsewhere classified (R26.2);Hemiplegia and hemiparesis Hemiplegia - Right/Left: Left Hemiplegia - dominant/non-dominant: Non-dominant Hemiplegia - caused by: Cerebral infarction     Time: 1415-1435 PT Time Calculation (min) (ACUTE ONLY): 20 min  Charges:  $Neuromuscular Re-education: 8-22 mins                     Earney Navy, PTA Acute Rehabilitation Services Pager: [phone] Office: [phone]     Darliss Cheney 01/23/2020, 5:09 PM

## 2020-01-23 NOTE — Progress Notes (Addendum)
Please notify EP service when patient is planned for discharge.   Will order heart monitor to be placed at time of discharge, prior to her leaving.  Thank you   Tommye Standard, PA-C   Late entry: Pt seen earlier today 14:45 Pt to discharge today 14 day ZIo AT ordered.  EKG department made aware Pt made aware as well as the RN (said EKG department had already contacted her) EP follow up is in place  Juno [place], [place]

## 2020-01-23 NOTE — Anesthesia Preprocedure Evaluation (Signed)
Anesthesia Evaluation  Patient identified by MRN, date of birth, ID band Patient awake    Reviewed: Allergy & Precautions, NPO status , Patient's Chart, lab work & pertinent test results  Airway Mallampati: I  TM Distance: >3 FB Neck ROM: Full    Dental   Pulmonary    Pulmonary exam normal        Cardiovascular Normal cardiovascular exam     Neuro/Psych CVA    GI/Hepatic   Endo/Other    Renal/GU      Musculoskeletal   Abdominal   Peds  Hematology   Anesthesia Other Findings   Reproductive/Obstetrics                             Anesthesia Physical Anesthesia Plan  ASA: III  Anesthesia Plan: MAC   Post-op Pain Management:    Induction: Intravenous  PONV Risk Score and Plan: 2 and Treatment may vary due to age or medical condition  Airway Management Planned: Nasal Cannula  Additional Equipment:   Intra-op Plan:   Post-operative Plan:   Informed Consent: I have reviewed the patients History and Physical, chart, labs and discussed the procedure including the risks, benefits and alternatives for the proposed anesthesia with the patient or authorized representative who has indicated his/her understanding and acceptance.       Plan Discussed with: CRNA and Surgeon  Anesthesia Plan Comments:         Anesthesia Quick Evaluation

## 2020-01-23 NOTE — Interval H&P Note (Signed)
History and Physical Interval Note:  01/23/2020 8:49 AM  Glenda Hines  has presented today for surgery, with the diagnosis of STROKE.  The various methods of treatment have been discussed with the patient and family. After consideration of risks, benefits and other options for treatment, the patient has consented to  Procedure(s): TRANSESOPHAGEAL ECHOCARDIOGRAM (TEE) (N/A) as a surgical intervention.  The patient's history has been reviewed, patient examined, no change in status, stable for surgery.  I have reviewed the patient's chart and labs.  Questions were answered to the patient's satisfaction.     Jenkins Rouge

## 2020-01-23 NOTE — Progress Notes (Signed)
Discharge instructions given. Patient verbalized understanding and all questions were answered.  ?

## 2020-01-23 NOTE — Discharge Summary (Addendum)
Physician Discharge Summary  Patient ID: Glenda Hines MRN: [id number] DOB/AGE: 1986-05-05 34 y.o.  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 Embolic infarct, unclear etiology.  There was a CT change, and therefore the CT perfusion was deemed unreliable due to pseudonormalization, and therefore she was taken to MRI emergently.  The diffusion change on MRI appeared to be less than what was ischemic by CT perfusion and the diffusion/FLAIR mismatch was significant and therefore the decision was made to proceed with thrombectomy. She is doing much better post tx.  She is ambulating with minimum assistance.  Left facial weakness and slurred speech is improved, still has mild diminished fine finger movements on the left.  TEE was performed earlier today and confirm small PFO and no other cardiac source of embolism.  Hypercoagulable lab work is still yet pending. She may also need to f/u with genetics for further eval. Patient was seen by EP team and refused loop recorder and prefers to have outpatient cardiac monitor instead. She will need out pt f/u for possible PFO closure.   Consults: NIR, cardiology, rehab team  Significant Diagnostic Studies: neuro imaging  Treatments: Thrombectomy  Discharge Exam: Blood pressure 122/63, pulse 83, temperature 97.8 F (36.6 C), temperature source Temporal, resp. rate 13, height 5' 7.5" (1.715 m), weight 83.9 kg, SpO2 96 %, unknown if currently breastfeeding. Constitutional: Appears well-developed and well-nourished.  Young Caucasian lady Psych: Affect appropriate to situation Eyes: No scleral injection HENT: No OP obstrucion Head: Normocephalic.   Cardiovascular: Normal rate and regular rhythm.  Respiratory: Effort normal, non-labored breathing GI: Soft.  No distension. There is no tenderness.  Skin: WDI   Neuro: Mental Status: Patient is awake, alert, oriented to person, place, month, year, and situation. Speech is slightly dysarthric, no difficulties with naming, repeating, comprehension.  Able to give a good history. Cranial Nerves: II: partial hemianopsia III,IV, VI:  Extraocular movements are full pupils equal, round and reactive to light V: Facial sensation is symmetric to temperature VII: Left facial droop VIII: hearing is intact to voice X: Palat elevates symmetrically XI: Shoulder shrug is symmetric. XII: tongue is midline without atrophy or fasciculations.  Motor: Right upper and lower extremity 5/5.  Left upper extremity 5/5, with mild drift only.  left lower extremity 5/5, w/o drift.  Mild weakness of left grip and intrinsic hand muscles.  Orbits right over left upper extremity.  Fine finger movements and foot tapping are diminished on the left compared to the right. Drift Sensory: Sensation is symmetric to light touch and temperature in the arms and leg on the right Positive left-sided neglect but mild Deep Tendon Reflexes: 2+ and symmetric in the biceps and patellae.  Plantars: Toes are downgoing bilaterally.  Cerebellar: FNF and HKS on the right   NIHSS score 4.  Disposition:  d/c home with out pt rehab  Discharge Instructions     Ambulatory referral to Occupational Therapy   Complete by: As directed    Ambulatory referral to Physical Therapy   Complete by: As directed    Ambulatory referral to Speech Therapy   Complete by: As directed       Allergies as of 01/23/2020       Reactions   Oxycodone  Shortness Of Breath   Vancomycin Rash   Squid Oil Nausea And Vomiting   Squid    Sulfa Antibiotics Rash   Tape Rash        Medication List     STOP taking these medications     norethindrone-ethinyl estradiol 1-20 MG-MCG tablet Commonly known as: LOESTRIN       TAKE these medications     stroke: mapping our early stages of recovery book Misc 1 each by Does not apply route once for 1 dose.   aspirin 81 MG EC tablet Take 1 tablet (81 mg total) by mouth daily. Start taking on: January 24, 2020   atorvastatin 40 MG tablet Commonly known as: LIPITOR Take 1 tablet (40 mg total) by mouth daily. Start taking on: January 24, 2020   clopidogrel 75 MG tablet Commonly known as: PLAVIX Take 1 tablet (75 mg total) by mouth daily. Start taking on: January 24, 2020   MULTIVITAMIN ADULT PO Take 1 tablet by mouth daily.   VITAMIN K2-VITAMIN D3 PO Take 1 tablet by mouth daily. "Health As It Ought To Be" 7500 iu, 215mcg       Follow-up Information     [facility].   Specialty: Rehabilitation Why: The outpatient therapy will contact you for the first appointment. Contact information: [place] [id number] [place] [id number] [phone]          D/c time spent: 63min Signed: [place] 01/23/2020, 2:20 PM I have personally obtained history,examined this patient, reviewed notes, independently viewed imaging studies, participated in medical decision making and plan of care.ROS completed by me personally and pertinent positives fully documented  I have made any additions or clarifications directly to the above note. Agree with note above. Pt advised to f/u Dr Burt Knack for PFO closure as outpt and Dr Leonie Man in 6 weeks for f/u of hypercoagulable panel labs. Advised to stop birth control pills.  Antony Contras, MD Medical Director [facility] Pager: [phone] 01/23/2020 3:46 PM

## 2020-01-23 NOTE — Progress Notes (Signed)
STROKE TEAM PROGRESS NOTE   INTERVAL HISTORY Her husband and mom are at the bedside.  Patient is doing much better.  She is ambulating with minimum assistance.  Left facial weakness and slurred speech is improved.  She still has mild diminished fine finger movements on the left.  TEE was performed earlier today and confirm small PFO and no other cardiac source of embolism.  Hypercoagulable lab work is still yet pending.  Patient was seen by EP team and refused loop recorder and prefers to have outpatient cardiac monitor instead.  Vitals:   01/23/20 1142 01/23/20 1155 01/23/20 1205 01/23/20 1210  BP: (!) 103/40 (!) 105/44 (!) 101/52 122/63  Pulse: 76 74 (!) 101 83  Resp: (!) 26 (!) 23 (!) 21 13  Temp:      TempSrc:      SpO2: 98% 96% 98% 96%  Weight:      Height:        CBC:  Recent Labs  Lab 01/21/20 0930 01/21/20 0938  WBC 5.0  --   NEUTROABS 2.6  --   HGB 12.9 12.2  HCT 38.5 36.0  MCV 88.3  --   PLT 273  --     Basic Metabolic Panel:  Recent Labs  Lab 01/21/20 0930 01/21/20 0938  NA 138 140  K 3.6 3.7  CL 105 105  CO2 23  --   GLUCOSE 154* 148*  BUN 10 11  CREATININE 0.84 0.70  CALCIUM 8.8*  --    Lipid Panel:     Component Value Date/Time   CHOL 245 (H) 01/22/2020 0438   TRIG 97 01/22/2020 0438   HDL 62 01/22/2020 0438   CHOLHDL 4.0 01/22/2020 0438   VLDL 19 01/22/2020 0438   LDLCALC 164 (H) 01/22/2020 0438   HgbA1c:  Lab Results  Component Value Date   HGBA1C 5.1 01/22/2020   Urine Drug Screen:     Component Value Date/Time   LABOPIA NONE DETECTED 01/22/2020 1340   COCAINSCRNUR NONE DETECTED 01/22/2020 1340   LABBENZ NONE DETECTED 01/22/2020 1340   AMPHETMU NONE DETECTED 01/22/2020 1340   THCU NONE DETECTED 01/22/2020 1340   LABBARB NONE DETECTED 01/22/2020 1340    Alcohol Level No results found for: [facility]  IMAGING past 24 hours ECHO TEE  Result Date: 01/23/2020    TRANSESOPHOGEAL ECHO REPORT   Patient Name:   Glenda Hines Date of Exam:  01/23/2020 Medical Rec #:  [id number]       Height:       67.5 in Accession #:    [id number]      Weight:       185.0 lb Date of Birth:  Feb 14, 1986       BSA:          1.967 m Patient Age:    33 years        BP:           113/76 mmHg Patient Gender: F               HR:           83 bpm. Exam Location:  Inpatient Procedure: Transesophageal Echo, 3D Echo and Color Doppler Indications:     Stroke  History:         Patient has prior history of Echocardiogram examinations, most                  recent 01/21/2020. Abnormal ECG; Arrythmias:SVT. Ablation.  Sonographer:  Roseanna Rainbow RDCS Referring Phys:  [id number] [place] Diagnosing Phys: Jenkins Rouge MD PROCEDURE: After discussion of the risks and benefits of a TEE, an informed consent was obtained from the patient. The transesophogeal probe was passed without difficulty through the esophogus of the patient. Imaged were obtained with the patient in a left lateral decubitus position. Local oropharyngeal anesthetic was provided with Cetacaine. Sedation performed by different physician. The patient was monitored while under deep sedation. Anesthestetic sedation was provided intravenously by Anesthesiology: 200mg  of Propofol, 60mg  of Lidocaine. The patient's vital signs; including heart rate, blood pressure, and oxygen saturation; remained stable throughout the procedure. The patient developed no complications during the procedure. IMPRESSIONS  1. Left ventricular ejection fraction, by estimation, is 60 to 65%. The left ventricle has normal function. The left ventricle has no regional wall motion abnormalities.  2. Right ventricular systolic function is normal. The right ventricular size is normal.  3. No left atrial/left atrial appendage thrombus was detected.  4. The mitral valve is normal in structure. Trivial mitral valve regurgitation. No evidence of mitral stenosis.  5. The aortic valve is normal in structure. Aortic valve regurgitation is not visualized. No aortic  stenosis is present.  6. The inferior vena cava is normal in size with greater than 50% respiratory variability, suggesting right atrial pressure of 3 mmHg.  7. Evidence of atrial level shunting detected by color flow Doppler. Small PFO noted by 2D / color and positive bubble study with right to left shunting. Conclusion(s)/Recommendation(s): Normal biventricular function without evidence of hemodynamically significant valvular heart disease. FINDINGS  Left Ventricle: Left ventricular ejection fraction, by estimation, is 60 to 65%. The left ventricle has normal function. The left ventricle has no regional wall motion abnormalities. The left ventricular internal cavity size was normal in size. There is  no left ventricular hypertrophy. Right Ventricle: The right ventricular size is normal. No increase in right ventricular wall thickness. Right ventricular systolic function is normal. Left Atrium: Left atrial size was normal in size. No left atrial/left atrial appendage thrombus was detected. Right Atrium: Right atrial size was normal in size. Pericardium: There is no evidence of pericardial effusion. Mitral Valve: The mitral valve is normal in structure. Normal mobility of the mitral valve leaflets. Trivial mitral valve regurgitation. No evidence of mitral valve stenosis. Tricuspid Valve: The tricuspid valve is normal in structure. Tricuspid valve regurgitation is not demonstrated. No evidence of tricuspid stenosis. Aortic Valve: The aortic valve is normal in structure. Aortic valve regurgitation is not visualized. No aortic stenosis is present. Pulmonic Valve: The pulmonic valve was normal in structure. Pulmonic valve regurgitation is not visualized. No evidence of pulmonic stenosis. Aorta: The aortic root is normal in size and structure. Venous: The inferior vena cava is normal in size with greater than 50% respiratory variability, suggesting right atrial pressure of 3 mmHg. IAS/Shunts: Evidence of atrial level  shunting detected by color flow Doppler. Agitated saline contrast was given intravenously to evaluate for intracardiac shunting. Small PFO noted by 2D / color and positive bubble study with right to left shunting. Jenkins Rouge MD Electronically signed by Jenkins Rouge MD Signature Date/Time: 01/23/2020/1:13:02 PM    Final     PHYSICAL EXAM Constitutional: Appears well-developed and well-nourished.  Young Caucasian lady Psych: Affect appropriate to situation Eyes: No scleral injection HENT: No OP obstrucion Head: Normocephalic.  Cardiovascular: Normal rate and regular rhythm.  Respiratory: Effort normal, non-labored breathing GI: Soft. No distension. There is no tenderness.  Skin: WDI  Neuro: Mental Status: Patient is awake, alert, oriented to person, place, month, year, and situation. Speech is slightly dysarthric, no difficulties with naming, repeating, comprehension.  Able to give a good history. Cranial Nerves: II: partial hemianopsia III,IV, VI:  Extraocular movements are full pupils equal, round and reactive to light V: Facial sensation is symmetric to temperature VII: Left facial droop VIII: hearing is intact to voice X: Palat elevates symmetrically XI: Shoulder shrug is symmetric. XII: tongue is midline without atrophy or fasciculations.  Motor: Right upper and lower extremity 5/5.  Left upper extremity 5/5, with mild drift only.  left lower extremity 5/5, w/o drift.  Mild weakness of left grip and intrinsic hand muscles.  Orbits right over left upper extremity.  Fine finger movements and foot tapping are diminished on the left compared to the right. Drift Sensory: Sensation is symmetric to light touch and temperature in the arms and leg on the right Positive left-sided neglect but mild Deep Tendon Reflexes: 2+ and symmetric in the biceps and patellae.  Plantars: Toes are downgoing bilaterally.  Cerebellar: FNF and HKS on the right  NIHSS score 4. ASSESSMENT/PLAN Ms.  Kimmey Rowsey is a 34 year old female with no previous PMH or stroke risks. Embolic infarct, unclear etiology.  There was a CT change, and therefore the CT perfusion was deemed unreliable due to pseudonormalization, and therefore she was taken to MRI emergently.  The diffusion change on MRI appeared to be less than what was ischemic by CT perfusion and the diffusion/FLAIR mismatch was significant and therefore the decision was made to proceed with thrombectomy.  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 Hyperdense RMCA at bifurcation  CTA head & neck: R M1 thrombus, R M3 occlusion. No ICA stenosis  CT perfusion deemed unreliable; pseudonormalization noted    MRI : there was diffusion change on initial MRI; f/u MRI showed diffuse RMCA infarcts, tiny petechial hemorrhage noted.  2D Echo: 60% EF, no shunt or PFO noted. Will check TCD w/bubble and TEE as well as LE [place].   LDL 164  HgbA1c No results found for requested labs within last 26280 hours.  Lovenox for VTE prophylaxis    Diet   Diet regular Room service appropriate? Yes; Fluid consistency: Thin    none prior to admission, now on ASA + Plavix for 3 weeks  Therapy recommendations: DAPT x3wk, then monotherapy with ASA only (unless need for [facility] is found)  Disposition:  tbd  NO known Hypertension . Long-term BP goal normotensive  Hyperlipidemia  Home meds: none  LDL 164, goal < 70  Add Lipitor 40mg  PO QHS  Continue statin at discharge   No known Diabetes Recent Labs    01/21/20 0928  GLUCAP 126*    Other Stroke Risk Factors  none  Hospital day # 2 Continue aspirin and Plavix for 3 weeks followed by aspirin alone.  Add statin for elevated lipids.  Refer to Dr. Sherren Mocha interventional cardiologist for outpatient consult to discuss endovascular PFO closure after discharge.  EP team to arrange for outpatient cardiac monitoring.  Follow-up in the stroke clinic in 6  weeks and review hypercoagulable lab work.  Discharge home today with home physical occupational therapy.  She was advised to continue to not take birth control pills and to increase her activity slowly as tolerated Long discussion with the patient and husband and mother and answered questions about her care.  Antony Contras, MD Medical Director [facility] Pager: [phone]  01/23/2020 2:05 PM  To contact Stroke Continuity provider, please refer to [email or web address] After hours, contact General Neurology

## 2020-01-23 NOTE — Progress Notes (Signed)
PT Cancellation Note  Patient Details Name: Glenda Hines MRN: [id number] DOB: January 10, 1986   Cancelled Treatment:    Reason Eval/Treat Not Completed: Patient at procedure or test/unavailable attempted to see pt for PT treatment. Pt being transported for TEE. PT will continue to follow acutely.    Earney Navy, PTA Acute Rehabilitation Services Pager: [phone] Office: [phone]   01/23/2020, 10:50 AM

## 2020-01-23 NOTE — Progress Notes (Signed)
Zio patch placed onto patient.  All instructions and information reviewed with patient, they verbalize understanding with no questions. 

## 2020-01-23 NOTE — Progress Notes (Signed)
  Echocardiogram Echocardiogram Transesophageal has been performed.  Bobbye Charleston 01/23/2020, 11:48 AM

## 2020-01-24 ENCOUNTER — Encounter: Payer: Self-pay | Admitting: *Deleted

## 2020-01-24 DIAGNOSIS — I639 Cerebral infarction, unspecified: Secondary | ICD-10-CM | POA: Diagnosis not present

## 2020-01-24 LAB — CARDIOLIPIN ANTIBODIES, IGG, IGM, IGA
Anticardiolipin IgA: <9 APL U/mL (ref 0–11)
Anticardiolipin IgG: <9 GPL U/mL (ref 0–14)
Anticardiolipin IgM: <9 MPL U/mL (ref 0–12)

## 2020-01-24 LAB — BETA-2-GLYCOPROTEIN I ABS, IGG/M/A
Beta-2 Glyco I IgG: <9 GPI IgG units (ref 0–20)
Beta-2-Glycoprotein I IgA: <9 GPI IgA units (ref 0–25)
Beta-2-Glycoprotein I IgM: <9 GPI IgM units (ref 0–32)

## 2020-01-27 LAB — PROTHROMBIN GENE MUTATION

## 2020-01-28 LAB — FACTOR 5 LEIDEN

## 2020-01-30 ENCOUNTER — Ambulatory Visit: Payer: BC Managed Care – PPO | Attending: Neurology

## 2020-01-30 ENCOUNTER — Other Ambulatory Visit: Payer: Self-pay

## 2020-01-30 VITALS — BP 142/84

## 2020-01-30 DIAGNOSIS — M6281 Muscle weakness (generalized): Secondary | ICD-10-CM | POA: Insufficient documentation

## 2020-01-30 DIAGNOSIS — R471 Dysarthria and anarthria: Secondary | ICD-10-CM | POA: Diagnosis not present

## 2020-01-30 DIAGNOSIS — R2689 Other abnormalities of gait and mobility: Secondary | ICD-10-CM | POA: Diagnosis not present

## 2020-01-30 DIAGNOSIS — R41841 Cognitive communication deficit: Secondary | ICD-10-CM | POA: Insufficient documentation

## 2020-01-30 NOTE — Therapy (Signed)
[facility] [address] [place], [place], [address] Phone: [phone]   Fax:  [phone]  Physical Therapy Evaluation  Patient Details  Name: Glenda Hines MRN: [id number] Date of Birth: [birth year] Referring Provider (PT): Antony Contras   Encounter Date: 01/30/2020  PT End of Session - 01/30/20 1621    Visit Number  1    Number of Visits  4    Date for PT Re-Evaluation  02/29/20    PT Start Time  [id number]    PT Stop Time  1622    PT Time Calculation (min)  52 min    Activity Tolerance  Patient tolerated treatment well    Behavior During Therapy  [facility] for tasks assessed/performed       Past Medical History:  Diagnosis Date  . Pituitary tumor   . SVT (supraventricular tachycardia) (HCC)     Past Surgical History:  Procedure Laterality Date  . BUBBLE STUDY  01/23/2020   Procedure: BUBBLE STUDY;  Surgeon: Josue Hector, MD;  Location: [facility] ENDOSCOPY;  Service: Cardiovascular;;  . catheter ablation    . IR CT HEAD LTD  01/21/2020  . IR PERCUTANEOUS ART THROMBECTOMY/INFUSION INTRACRANIAL INC DIAG ANGIO  01/21/2020  . KNEE SURGERY    . RADIOLOGY WITH ANESTHESIA N/A 01/21/2020   Procedure: RADIOLOGY WITH ANESTHESIA;  Surgeon: Radiologist, Medication, MD;  Location: [facility];  Service: Radiology;  Laterality: N/A;  . TEE WITHOUT CARDIOVERSION N/A 01/23/2020   Procedure: TRANSESOPHAGEAL ECHOCARDIOGRAM (TEE);  Surgeon: Josue Hector, MD;  Location: [facility] ENDOSCOPY;  Service: Cardiovascular;  Laterality: N/A;  . TONSILLECTOMY      Vitals:   01/30/20 1558  BP: (!) 142/84     Subjective Assessment - 01/30/20 1535    Subjective  Pt had right MCA on 01/21/20. Pt reports that she woke up and left side was not working and face was drooping. She was hospitalized 4/13-4/15 and had mechanical thrombectomy. Only factors that may have contributed was she was on birth control and they found a small hole in her heart. She will be  following up with cardiologist about that but seemed very minor.    Patient Stated Goals  Pt wants to be able to move return to prior function which was caring for children and getting back to exercise. Pt also wants to improve her movement in face.    Currently in Pain?  No/denies         [facility] PT Assessment - 01/30/20 1539      Assessment   Medical Diagnosis  right MCA    Referring Provider (PT)  Antony Contras    Onset Date/Surgical Date  01/21/20    Hand Dominance  Right    Prior Therapy  Acute care PT      Balance Screen   Has the patient fallen in the past 6 months  No    Has the patient had a decrease in activity level because of a fear of falling?   No    Is the patient reluctant to leave their home because of a fear of falling?   No      Home Environment   Living Environment  Private residence    Living Arrangements  Spouse/significant other;Children;Parent   32 and 33 year old children   Available Help at Discharge  Family    Type of [place] to enter    Entrance Stairs-Number of Steps  3  Entrance Stairs-Rails  None    Home Layout  Multi-level    Alternate Level Stairs-Number of Steps  12    Alternate Level Stairs-Rails  Left    Home Equipment  None    Additional Comments  Pt reports staying with parents now as they are in process of remodeling home they just bought. 2 steps to enter at parents and one level. Her new home will have 2-3 steps and no rail. Bedroom will be on main floor but does have 3 levels.      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    Strength Assessment Site  Shoulder;Elbow;Hand;Hip;Knee;Ankle    Right/Left Shoulder  Right;Left    Right Shoulder Flexion  5/5    Left Shoulder Flexion  5/5    Right/Left Elbow  Right;Left    Right Elbow Flexion  5/5    Right Elbow Extension  5/5    Left Elbow Flexion  5/5    Left Elbow Extension  5/5    Right/Left hand  Right;Left    Right Hand Gross Grasp  Functional    Left Hand Gross Grasp  Functional    Right/Left Hip  Right;Left    Right Hip Flexion  5/5    Right Hip Extension  4+/5    Right Hip ABduction  5/5    Left Hip Flexion  5/5    Left Hip Extension  4+/5    Left Hip ABduction  5/5    Right/Left Knee  Right;Left    Right Knee Flexion  5/5    Right Knee Extension  5/5    Left Knee Flexion  5/5    Left Knee Extension  5/5    Right/Left Ankle  Right;Left    Right Ankle Dorsiflexion  5/5    Right Ankle Plantar Flexion  5/5    Right Ankle Inversion  5/5    Right Ankle Eversion  5/5    Left Ankle Dorsiflexion  4+/5    Left Ankle Plantar Flexion  5/5    Left Ankle Inversion  4+/5    Left Ankle Eversion  4+/5      Bed Mobility   Bed Mobility  Rolling Right;Rolling Left;Supine to Sit;Sit to Supine    Rolling Right  Independent    Rolling Left  Independent    Supine to Sit  Independent    Sit to Supine  Independent      Transfers   Transfers  Sit to Stand;Stand to Sit    Sit to Stand  7: Independent    Stand to Sit  7: Independent      Ambulation/Gait  Ambulation/Gait  Yes    Ambulation/Gait Assistance  7: Independent    Ambulation/Gait Assistance Details  PT monitoring vitals during gait.    Ambulation Distance (Feet)  1265 Feet    Assistive device  None    Gait  Pattern  Step-through pattern    Ambulation Surface  Level;Indoor    Gait velocity  8.11 sec=1.16m/s    Stairs  Yes    Stairs Assistance  5: Supervision    Stair Management Technique  No rails;Alternating pattern    Number of Stairs  4      6 Minute Walk- Baseline   6 Minute Walk- Baseline  yes    BP (mmHg)  142/84    Modified Borg Scale for Dyspnea  0- Nothing at all      6 Minute walk- Post Test   6 Minute Walk Post Test  yes    HR (bpm)  100    02 Sat (%RA)  99 %    Modified Borg Scale for Dyspnea  1- Very mild shortness of breath      6 minute walk test results    Aerobic Endurance Distance Walked  1265      High Level Balance   High Level Balance Comments  Pt able to walk on toes and on heels x 20'. SLS 18 sec RLE and >20 sec LLE.      Functional Gait  Assessment   Gait assessed   Yes    Gait Level Surface  Walks 20 ft in less than 5.5 sec, no assistive devices, good speed, no evidence for imbalance, normal gait pattern, deviates no more than 6 in outside of the 12 in walkway width.    Change in Gait Speed  Able to smoothly change walking speed without loss of balance or gait deviation. Deviate no more than 6 in outside of the 12 in walkway width.    Gait with Horizontal Head Turns  Performs head turns smoothly with no change in gait. Deviates no more than 6 in outside 12 in walkway width    Gait with Vertical Head Turns  Performs head turns with no change in gait. Deviates no more than 6 in outside 12 in walkway width.    Gait and Pivot Turn  Pivot turns safely within 3 sec and stops quickly with no loss of balance.    Step Over Obstacle  Is able to step over 2 stacked shoe boxes taped together (9 in total height) without changing gait speed. No evidence of imbalance.    Gait with Narrow Base of Support  Is able to ambulate for 10 steps heel to toe with no staggering.    Gait with Eyes Closed  Walks 20 ft, no assistive devices, good speed, no evidence of imbalance, normal gait  pattern, deviates no more than 6 in outside 12 in walkway width. Ambulates 20 ft in less than 7 sec.    Ambulating Backwards  Walks 20 ft, no assistive devices, good speed, no evidence for imbalance, normal gait    Steps  Alternating feet, no rail.    Total Score  30                Objective measurements completed on examination: See above findings.              PT Education - 01/30/20 1929    Education Details  PT plan of care. Educated on starting walking program increasing from 6 min up to 10 min  by next week. Instructed to work on moving muscles of face with different expressions in mirror.  Explained process of stroke recovery with first 6 months with most return and fastest.    Person(s) Educated  Patient;Parent(s)    Methods  Explanation    Comprehension  Verbalized understanding       PT Short Term Goals - 01/30/20 1946      PT SHORT TERM GOAL #1   Title  STG=LTGs    Time  --    Period  --    Status  --    Target Date  --      PT SHORT TERM GOAL #2   Title  --    Time  --    Period  --    Status  --    Target Date  --      PT SHORT TERM GOAL #3   Title  --    Time  --    Period  --    Status  --    Target Date  --      PT SHORT TERM GOAL #4   Title  --    Baseline  --    Time  --    Period  --    Status  --    Target Date  --        PT Long Term Goals - 01/30/20 1951      PT LONG TERM GOAL #1   Title  Pt will be independent with HEP for strengthening and walking program to conitnue gains on own.    Time  4    Period  Weeks    Status  New    Target Date  02/29/20      PT LONG TERM GOAL #2   Title  Pt will report being able to walk 20-30 min for improved activity tolerance and community mobility.    Time  4    Period  Weeks    Status  New    Target Date  02/29/20      PT LONG TERM GOAL #3   Title  Pt will ascend/descend  flight of stairs with reciprocal pattern without rails carrying 30# weight mimicking carrying 34 year old  for improved functional strength.    Time  4    Period  Weeks    Status  New    Target Date  02/29/20      PT LONG TERM GOAL #4   Title  Pt will increase 6 min walk from 1265' to >1500' for improved activity tolerance.    Time  4    Period  Weeks    Status  New    Target Date  02/29/20             Plan - 01/30/20 1933    Clinical Impression Statement  Pt is 34 y/o female with right MCA CVA on 01/21/20 s/p mechanical thrombectomy. Pt is progressing very well after stroke with weakness noted in facial muscles on left. Will be seeing ST for evaluation tomorrow. Pt was very active prior to stroke and her biggest concern for PT is decreased energy compared to prior. Pt completed 6 min walk test covering 1265' with good vital response. This is decreased from norm of >1600'. Pt had mild weakness at left ankle and hip extensors. Did well on balance testing with 30/30 on FGA and able to maintain SLS 18 sec on RLE and >20 sec on  LLE. Pt will benefit from PT to work on improving activity tolerance to help return to active lifestyle safely.    Examination-Activity Limitations  Locomotion Level;Stairs    Examination-Participation Restrictions  Community Activity;Driving    Stability/Clinical Decision Making  Stable/Uncomplicated    Clinical Decision Making  Low    Rehab Potential  Excellent    PT Frequency  1x / week   plus eval   PT Duration  3 weeks    PT Treatment/Interventions  ADLs/Self Care Home Management;Functional mobility training;Stair training;Gait training;Therapeutic activities;Therapeutic exercise;Neuromuscular re-education;Patient/family education    PT Next Visit Plan  Aerobic activities perhaps gait on treadmill monitoring vitals. Dynamic gait, strengthening.    Consulted and Agree with Plan of Care  Patient;Family member/caregiver    Family Member Consulted  mom       Patient will benefit from skilled therapeutic intervention in order to improve the following deficits and  impairments:  Decreased activity tolerance, Decreased strength, Abnormal gait  Visit Diagnosis: Other abnormalities of gait and mobility  Muscle weakness (generalized)     Problem List Patient Active Problem List   Diagnosis Date Noted  . PFO (patent foramen ovale)   . Left-sided weakness   . Cryptogenic stroke ([place]) 01/21/2020    Electa Sniff, PT, DPT, NCS 01/30/2020, 7:54 PM  [facility] [address] [place], [place], [address] Phone: [phone]   Fax:  [phone]  Name: Glenda Hines MRN: [id number] Date of Birth: Feb 12, 1986

## 2020-01-31 ENCOUNTER — Ambulatory Visit: Payer: BC Managed Care – PPO

## 2020-01-31 DIAGNOSIS — R2689 Other abnormalities of gait and mobility: Secondary | ICD-10-CM | POA: Diagnosis not present

## 2020-01-31 DIAGNOSIS — R471 Dysarthria and anarthria: Secondary | ICD-10-CM

## 2020-01-31 DIAGNOSIS — M6281 Muscle weakness (generalized): Secondary | ICD-10-CM | POA: Diagnosis not present

## 2020-01-31 DIAGNOSIS — R41841 Cognitive communication deficit: Secondary | ICD-10-CM | POA: Diagnosis not present

## 2020-01-31 NOTE — Therapy (Signed)
[facility] [address] [place], [place], [address] Phone: [phone]   Fax:  [phone]  Speech Language Pathology Evaluation  Patient Details  Name: Glenda Hines MRN: [id number] Date of Birth: 04/28/1986 Referring Provider (SLP): Royal Hawthorn., MD   Encounter Date: 01/31/2020  End of Session - 01/31/20 1742    Visit Number  1    Number of Visits  17    Date for SLP Re-Evaluation  05/01/20   90 days   SLP Start Time  1106    SLP Stop Time   1146    SLP Time Calculation (min)  40 min    Activity Tolerance  Patient tolerated treatment well       Past Medical History:  Diagnosis Date  . Pituitary tumor   . SVT (supraventricular tachycardia) (HCC)     Past Surgical History:  Procedure Laterality Date  . BUBBLE STUDY  01/23/2020   Procedure: BUBBLE STUDY;  Surgeon: Josue Hector, MD;  Location[facility] END[facility]COPY;  Service: Cardiovascular;;  . catheter ablation    . IR CT HEAD LTD  01/21/2020  . IR PERCUTANEOUS ART THROMBECTOMY/INFUSION INTRACRANIAL INC DIAG ANGIO  01/21/2020  . KNEE SURGERY    . RADIOLOGY WITH ANESTHESIA N/A 01/21/2020   Procedure: RADIOLOGY WITH ANESTHESIA;  Surgeon: Radiologist, Medication, MD;  Location: [facility];  Service: Radiology;  Laterality: N/A;  . TEE WITHOUT CARDIOVERSION N/A 01/23/2020   Procedure: TRANSESOPHAGEAL ECHOCARDIOGRAM (TEE);  Surgeon: Josue Hector, MD;  Location: [facility] ENDOSCOPY;  Service: Cardiovascular;  Laterality: N/A;  . TONSILLECTOMY      There were no vitals filed for this visit.  Subjective Assessment - 01/31/20 1119    Subjective  "It feels like I have to think harder now for something that was automatic before."    Patient is accompained by:  Family member   mother   Currently in Pain?  No/denies         SLP Evaluation [facility] - 01/31/20 1119      SLP Visit Information   SLP Received On  01/31/20    Referring Provider (SLP)  Royal Hawthorn., MD    Onset Date   01-31-20    Medical Diagnosis  rt MCA CVA      Subjective   Patient/Family Stated Goal  "If I could get (my oral) muscle strength back I would like that."      General Information   HPI  34 y.o. female admitted on 01/21/20 for L sided weakness/facial droop.  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  Pt with significant PMH of SVT, pituitary tumor, multiple R knee surgeries.       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     Cognition   Overall Cognitive Status  Impaired/Different from baseline   "slower to express myself"   Behaviors  Other (comment)   appeared anxious/nervous at times     Auditory Comprehension   Overall Auditory Comprehension  Appears within functional limits for tasks assessed      Verbal Expression   Overall Verbal Expression  Appears within  functional limits for tasks assessed      Written Expression   Dominant Hand  Right      Oral Motor/Sensory Function   Overall Oral Motor/Sensory Function  Impaired    Labial ROM  Reduced left    Labial Symmetry  Abnormal symmetry left    Labial Strength  Reduced Left    Labial Sensation  Within Functional Limits    Labial Coordination  Reduced    Lingual ROM  Reduced left   slight   Lingual Symmetry  Abnormal symmetry left    Lingual Strength  Reduced Left    Lingual Sensation  Within Functional Limits    Lingual Coordination  Reduced    Facial ROM  Reduced left      Motor Speech   Overall Motor Speech  Impaired    Phonation  --   ~195-~330Hz  dyamic range-pt appeared timid; 93dB "HEY"   Resonance  Within functional limits    Intelligibility  Intelligible    Phonation  --    Volume   Appropriate;Decibel Level   upper 60s dB   Pitch  Appropriate                      SLP Education - 01/31/20 1742    Education Details  HEP    Person(s) Educated  Patient;Parent(s)    Methods  Explanation;Demonstration;Verbal cues    Comprehension  Verbal cues required;Verbalized understanding;Returned demonstration;Need further instruction       SLP Short Term Goals - 01/31/20 1748      SLP SHORT TERM GOAL #1   Title  pt will complete HEP with rare min A over 2 sessions    Time  4    Period  Weeks    Status  New      SLP SHORT TERM GOAL #2   Title  pt will demo higher pitch range than ~330Hz  in therapy tasks    Time  4    Period  Weeks    Status  New       SLP Long Term Goals - 01/31/20 1749      SLP LONG TERM GOAL #1   Title  pt to perform HEP for dysarthria with independence in 3 sessions    Time  8    Period  Weeks   or 17 sessions, for all LTGs   Status  New      SLP LONG TERM GOAL #2   Title  pt will report incr in pitch range/intonation compared to first session of ST (digital recording will be taken of first ST session)    Time  8    Period  Weeks    Status  New      SLP LONG TERM GOAL #3   Title  pt will have further testing PRN re: difficulty expressing herself verbally    Time  8    Period  Weeks    Status  New      SLP LONG TERM GOAL #4   Title  pt will produce 100% intelligible speech in 25 minutees mod complex conversation x 3 sessions    Time  8    Period  Weeks    Status  New       Plan - 01/31/20 1742    Clinical Impression Statement  Pt presents today with very mild dysarthria (100% intelligibility) due to rt MCA CVA (with thrombectomy), including a reported limited Hz range (lowest was 195 Hz -  high was 330Hz  however pt appeared timid for frequency testing). "I can't sing like I used to," pt stated. Pt's volume was WNL and loud "Hey!" was 93dB. SLP feels pt could have been louder but was apprehensive about a louder  production.Additionally pt reports decr'd ability with verbal communication/slowness of sentence formulation which has been improving as time progresses since d/c. Suspect this is attention-based and will resolve over next 4- weeks but SLP to monitor    Speech Therapy Frequency  2x / week    Duration  --   8 weeks or 17 total sessions   Treatment/Interventions  Oral motor exercises;Functional tasks;Compensatory techniques;SLP instruction and feedback;Patient/family education;Internal/external aids;Cueing hierarchy   possible voice exercises   Potential to Achieve Goals  Good    Consulted and Agree with Plan of Care  Patient       Patient will benefit from skilled therapeutic intervention in order to improve the following deficits and impairments:   Dysarthria and anarthria  Cognitive communication deficit    Problem List Patient Active Problem List   Diagnosis Date Noted  . PFO (patent foramen ovale)   . Left-sided weakness   . Cryptogenic stroke ([place]) 01/21/2020    [facility] ,[place], [place]  01/31/2020, 5:54 PM  [facility] [address] [place], [place], [address] Phone: [phone]   Fax:  [phone]  Name: Tangia Oja MRN: [id number] Date of Birth: 1986-03-29

## 2020-01-31 NOTE — Patient Instructions (Signed)
  Purpose: To improve lip and tongue strength in order to help people understand you. These exercises can be done while looking in a mirror.  Do them twice a day.  Lips  1. Press hard and briefly hold all the beginning sounds in these words: Repeat each set 2 times     "ma ma ma"    "boo boo boo"  "pa pa pa."          "[place]"  "bye bye bye"  "pie pie pie"          "me me me"  "bee bee bee"  "pea pea pea"   2. Pucker your lips tightly and say "OOOO," then smile wide and say "EEEE."  Repeat _10_ times.  3. Practice whistling for _20-30__ seconds.  4. "Blow out" candles.  Repeat _20__ times.  5. Blow kisses - make them LOUD!  Repeat __10-15__ times.   Tip of Tongue  1. Say the sound "ta ta ta,"   "la la la,"          and "Engineering geologist."  Repeat __2__ times.               "tee tee tee" "lee lee lee"   "dee dee dee"        "too too too" "[place]"  "do do do"  2. Say "time," "took," "take," "town," and "Tom."  Repeat __3__ times.  3. Say "long," "look," "low," "lay" and "lie."  Repeat _3_ times.  4. Say "name," "neck," "not," "new," and "no."  Repeat __3__ times.  5. Say "down," "dip," "dot," "Don," and "do."  Repeat _3__ times   Back of Tongue  1. Say the sounds "ka ka ka" and "go go go."   Repeat __2__ times.       "cow cow cow" "guy guy guy"       "koo koo koo"  "goo goo goo"  2. Say "kick," "cake," "Anda Kraft," "key," "keep," "kite," and "Ken."  Repeat _3__ times.  3. Say "gate," "gag," "got," "get," "gone," and "go."  Repeat _3___ times.

## 2020-02-03 DIAGNOSIS — Z20828 Contact with and (suspected) exposure to other viral communicable diseases: Secondary | ICD-10-CM | POA: Diagnosis not present

## 2020-02-04 ENCOUNTER — Ambulatory Visit: Payer: BC Managed Care – PPO

## 2020-02-04 ENCOUNTER — Other Ambulatory Visit: Payer: Self-pay

## 2020-02-04 VITALS — BP 102/70

## 2020-02-04 DIAGNOSIS — M6281 Muscle weakness (generalized): Secondary | ICD-10-CM

## 2020-02-04 DIAGNOSIS — R2689 Other abnormalities of gait and mobility: Secondary | ICD-10-CM

## 2020-02-04 DIAGNOSIS — R471 Dysarthria and anarthria: Secondary | ICD-10-CM | POA: Diagnosis not present

## 2020-02-04 DIAGNOSIS — R41841 Cognitive communication deficit: Secondary | ICD-10-CM | POA: Diagnosis not present

## 2020-02-04 NOTE — Therapy (Signed)
[facility] [address] [place], [place], [address] Phone: [phone]   Fax:  [phone]  Speech Language Pathology Treatment  Patient Details  Name: Glenda Hines MRN: [id number] Date of Birth: 07-27-1986 Referring Provider (SLP): Royal Hawthorn., MD   Encounter Date: 02/04/2020  End of Session - 02/04/20 1529    Visit Number  2    Number of Visits  17    Date for SLP Re-Evaluation  05/01/20    SLP Start Time  [id number]    SLP Stop Time   1445    SLP Time Calculation (min)  42 min    Activity Tolerance  Patient tolerated treatment well       Past Medical History:  Diagnosis Date  . Pituitary tumor   . SVT (supraventricular tachycardia) (HCC)     Past Surgical History:  Procedure Laterality Date  . BUBBLE STUDY  01/23/2020   Procedure: BUBBLE STUDY;  Surgeon: Josue Hector, MD;  Location: [facility] ENDOSCOPY;  Service: Cardiovascular;;  . catheter ablation    . IR CT HEAD LTD  01/21/2020  . IR PERCUTANEOUS ART THROMBECTOMY/INFUSION INTRACRANIAL INC DIAG ANGIO  01/21/2020  . KNEE SURGERY    . RADIOLOGY WITH ANESTHESIA N/A 01/21/2020   Procedure: RADIOLOGY WITH ANESTHESIA;  Surgeon: Radiologist, Medication, MD;  Location: [facility];  Service: Radiology;  Laterality: N/A;  . TEE WITHOUT CARDIOVERSION N/A 01/23/2020   Procedure: TRANSESOPHAGEAL ECHOCARDIOGRAM (TEE);  Surgeon: Josue Hector, MD;  Location: [facility] ENDOSCOPY;  Service: Cardiovascular;  Laterality: N/A;  . TONSILLECTOMY      There were no vitals filed for this visit.  Subjective Assessment - 02/04/20 1518    Subjective  Pt tells SLP she does not have to think AS HARD prior to speaking; words are articulated easier than previous ST - but she IS still having to put more thought into how to make her articulation sound more WNL.    Patient is accompained by:  Family member   mother   Currently in Pain?  No/denies            ADULT SLP TREATMENT - 02/04/20 1520      General Information   Behavior/Cognition  Alert;Cooperative;Pleasant mood      Cognitive-Linquistic Treatment   Treatment focused on  Dysarthria    Skilled Treatment  SLP printed off another HEP as pt left hers at home, mistakenly, as well as the QOL measure (pt to bring QOL measure next session). Pt req'd mod cues usually, faded to min-mod cues occasionally, for exaggeration of labial and of anterior and posterior lingual musculature. SLP provided pt "tongue twisters" for practice on consonant-laden words. To assist intonation, SLP suggested pt read to her boys using hyper-intonation. SLP suggested pt use straw for another tongue exercise going from lt labial margin to rt labial margin.      Assessment / Recommendations / Plan   Plan  Continue with current plan of care      Progression Toward Goals   Progression toward goals  Progressing toward goals       SLP Education - 02/04/20 1528    Education Details  "tongue twisters", straw exercise from lt labial margin to rt labial margin, read with hyper-intonation to her boys    Person(s) Educated  Patient;Parent(s)    Methods  Explanation;Demonstration;Verbal cues    Comprehension  Verbalized understanding;Returned demonstration;Verbal cues required;Need further instruction       SLP Short Term Goals - 02/04/20 1533  SLP SHORT TERM GOAL #1   Title  pt will complete HEP with rare min A over 2 sessions    Time  4    Period  Weeks    Status  On-going      SLP SHORT TERM GOAL #2   Title  pt will demo higher pitch range than ~330Hz  in therapy tasks    Time  4    Period  Weeks    Status  On-going       SLP Long Term Goals - 02/04/20 1533      SLP LONG TERM GOAL #1   Title  pt to perform HEP for dysarthria with independence in 3 sessions    Time  8    Period  Weeks   or 17 sessions, for all LTGs   Status  On-going      SLP LONG TERM GOAL #2   Title  pt will report incr in pitch range/intonation compared to first session  of ST (digital recording will be taken in first 1-2 ST sessions)    Time  8    Period  Weeks    Status  Revised      SLP LONG TERM GOAL #3   Title  pt will have further testing PRN re: difficulty expressing herself verbally    Status  Deferred      SLP LONG TERM GOAL #4   Title  pt will produce 100% intelligible speech in 25 minutees mod complex conversation x 3 sessions    Time  8    Period  Weeks    Status  On-going      SLP LONG TERM GOAL #5   Title  pt QOL will be higher than her original report in the first days of ST    Time  8    Period  Weeks    Status  New       Plan - 02/04/20 1530    Clinical Impression Statement  Pt presents today with 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 Pt reports of decr'd ability with verbal communication/slowness of sentence formulation were better described today as pt taking more time to think about her articlation and trying to make it as exact as possible, which eliminates the possibility of a language deficit/component to pt's deficit areas. Pt would cont to benefit from a program of sklled ST focusing on improving oral muscle stregnth, and increasing speech clarity in conversation.    Speech Therapy Frequency  2x / week    Duration  --   8 weeks or 17 total sessions   Treatment/Interventions  Oral motor exercises;Functional tasks;Compensatory techniques;SLP instruction and feedback;Patient/family education;Internal/external aids;Cueing hierarchy   possible voice exercises   Potential to Achieve Goals  Good    Consulted and Agree with Plan of Care  Patient       Patient will benefit from skilled therapeutic intervention in order to improve the following deficits and impairments:   Dysarthria and anarthria    Problem List Patient Active Problem List   Diagnosis Date Noted  . PFO (patent foramen ovale)   . Left-sided weakness   . Cryptogenic stroke  ([place]) 01/21/2020    [facility] ,[place], [place]  02/04/2020, 3:35 PM  [facility] [address] [place], [place], [address] Phone: [phone]   Fax:  [phone]   Name: Naeemah Hines MRN: [id number] Date of Birth: Jun 23, 1986

## 2020-02-04 NOTE — Therapy (Signed)
[facility] [address] [place], [place], [address] Phone: [phone]   Fax:  [phone]  Physical Therapy Treatment  Patient Details  Name: Glenda Hines MRN: [id number] Date of Birth: 06/29/1986 Referring Provider (PT): Antony Contras   Encounter Date: 02/04/2020  PT End of Session - 02/04/20 1618    Visit Number  2    Number of Visits  4    Date for PT Re-Evaluation  02/29/20    PT Start Time  1532    PT Stop Time  1615    PT Time Calculation (min)  43 min    Activity Tolerance  Patient tolerated treatment well    Behavior During Therapy  [facility] for tasks assessed/performed       Past Medical History:  Diagnosis Date  . Pituitary tumor   . SVT (supraventricular tachycardia) (HCC)     Past Surgical History:  Procedure Laterality Date  . BUBBLE STUDY  01/23/2020   Procedure: BUBBLE STUDY;  Surgeon: Josue Hector, MD;  Location: [facility] ENDOSCOPY;  Service: Cardiovascular;;  . catheter ablation    . IR CT HEAD LTD  01/21/2020  . IR PERCUTANEOUS ART THROMBECTOMY/INFUSION INTRACRANIAL INC DIAG ANGIO  01/21/2020  . KNEE SURGERY    . RADIOLOGY WITH ANESTHESIA N/A 01/21/2020   Procedure: RADIOLOGY WITH ANESTHESIA;  Surgeon: Radiologist, Medication, MD;  Location: [facility];  Service: Radiology;  Laterality: N/A;  . TEE WITHOUT CARDIOVERSION N/A 01/23/2020   Procedure: TRANSESOPHAGEAL ECHOCARDIOGRAM (TEE);  Surgeon: Josue Hector, MD;  Location: [facility] ENDOSCOPY;  Service: Cardiovascular;  Laterality: N/A;  . TONSILLECTOMY      Vitals:   02/04/20 1537  BP: 102/70    Subjective Assessment - 02/04/20 1537    Subjective  Pt reports that she is doing ok. Does have mild headache which she occasionally gets. Noted some lightheadedness yesterday after taking her meds and bending over when she came back up. Better after eating. Heart monitor comes off Thursday. Sees cardiologist next week.    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 Pt also wants to improve her movement in face.    Currently in Pain?  Yes    Pain Score  1     Pain Location  Head    Pain Descriptors / Indicators  --   light, general                      [facility] Adult PT Treatment/Exercise - 02/04/20 1540      Ambulation/Gait   Ambulation/Gait  Yes    Gait Comments  Pt ambulated on treadmill x 12 min with 2 min warm up and cool down at 1.73mph and 1.7 otherwise. Pt was cued to take large steps. Did not hold on during gait with good reciprocal pattern. HR=82 and 112/74 after      Exercises   Exercises  Other Exercises    Other Exercises   Reciprocal gait over hurdles x 2 laps, fast steps diagonal over floor ladder x 4 then hopping over floor ladder x 4. HR=140 after and then cool down slow gait 230' with HR=114. Tapping 3 cones for SLS time x 4 each foot then switched to standing on airex with same activity. Then standing on airex with alternating taps on soccer x 10 then SLS on airex with moving soccerball side to side and front/back  x 10 each. Pt able to perform supervision.             PT Education - 02/04/20 2001    Education Details  Pt instructed to continue to progress gait increasing to 12-14 min/day. She was currently doing 24min. Pt to work on standing in corner on pillow with alternating taps on ball and with moving ball forward/back and side to side x 10 daily to work on SLS stability. PT also discussed working on opening/closing left eye and assisted left eyelid with fingers if needed.    Person(s) Educated  Patient    Methods  Explanation;Demonstration    Comprehension  Returned demonstration;Verbalized understanding       PT Short Term Goals - 01/30/20 1946      PT SHORT TERM GOAL #1   Title  STG=LTGs    Time  --    Period  --    Status  --    Target Date  --      PT SHORT TERM GOAL #2   Title  --    Time  --     Period  --    Status  --    Target Date  --      PT SHORT TERM GOAL #3   Title  --    Time  --    Period  --    Status  --    Target Date  --      PT SHORT TERM GOAL #4   Title  --    Baseline  --    Time  --    Period  --    Status  --    Target Date  --        PT Long Term Goals - 01/30/20 1951      PT LONG TERM GOAL #1   Title  Pt will be independent with HEP for strengthening and walking program to conitnue gains on own.    Time  4    Period  Weeks    Status  New    Target Date  02/29/20      PT LONG TERM GOAL #2   Title  Pt will report being able to walk 20-30 min for improved activity tolerance and community mobility.    Time  4    Period  Weeks    Status  New    Target Date  02/29/20      PT LONG TERM GOAL #3   Title  Pt will ascend/descend  flight of stairs with reciprocal pattern without rails carrying 30# weight mimicking carrying 34 year old for improved functional strength.    Time  4    Period  Weeks    Status  New    Target Date  02/29/20      PT LONG TERM GOAL #4   Title  Pt will increase 6 min walk from 1265' to >1500' for improved activity tolerance.    Time  4    Period  Weeks    Status  New    Target Date  02/29/20            Plan - 02/04/20 2003    Clinical Impression Statement  Pt tolerated gait activities well with good vital response. Plyometric activity was most tiring with increased HR after. Added SLS activities to HEP along with increasing walking time to 12-14 min daily.    Examination-Activity Limitations  Locomotion Level;Stairs  Examination-Participation Restrictions  Community Activity;Driving    Stability/Clinical Decision Making  Stable/Uncomplicated    Rehab Potential  Excellent    PT Frequency  1x / week   plus eval   PT Duration  3 weeks    PT Treatment/Interventions  ADLs/Self Care Home Management;Functional mobility training;Stair training;Gait training;Therapeutic activities;Therapeutic exercise;Neuromuscular  re-education;Patient/family education    PT Next Visit Plan  Aerobic activities like gait on treadmill monitoring vitals. Dynamic gait, strengthening. Stair training with carrying something. Any news on heart monitor she turned in? May have to wait until sees MD the following week.    Consulted and Agree with Plan of Care  Patient;Family member/caregiver    Family Member Consulted  mom       Patient will benefit from skilled therapeutic intervention in order to improve the following deficits and impairments:  Decreased activity tolerance, Decreased strength, Abnormal gait  Visit Diagnosis: Other abnormalities of gait and mobility  Muscle weakness (generalized)     Problem List Patient Active Problem List   Diagnosis Date Noted  . PFO (patent foramen ovale)   . Left-sided weakness   . Cryptogenic stroke ([place]) 01/21/2020    Electa Sniff, PT, DPT, NCS 02/04/2020, 8:07 PM  [facility] [address] [place], [place], [address] Phone: [phone]   Fax:  [phone]  Name: Glenda Hines MRN: [id number] Date of Birth: 10/19/1985

## 2020-02-04 NOTE — Patient Instructions (Addendum)
Speech Exercises  Repeat 2-3 times, 2 times a day   Call the cat "Buttercup" A calendar of [place], [place] Four floors to cover Yellow oil ointment Fellow lovers of felines Catastrophe in [place]' plums The church's chimes chimed Telling time 'til eleven Five valve levers Keep the gate closed Go see that guy Fat cows give milk [employer] Gophers Fat frogs flip freely Kohl's into bed Get that game to [employer] Thick thistles stick together Cinnamon aluminum linoleum Black bugs blood Lovely lemon linament Red leather, yellow leather  Big grocery buggy    Purple baby carriage [facility] Proper copper coffee pot Ripe purple cabbage Three free throws [employer] tackled  [employer] dipped the dessert  [facility] that [employer] of [employer] Shirts shrink, shells shouldn't [place] 49ers Take the tackle box File the flash message Give me five flapjacks Fundamental relatives Dye the pets purple Talking [place] time after time Dark chocolate chunks Political landscape of the kingdom Estate manager/land agent genius We played yo-yos yesterday   ======================================================  READ TO YOUR BOYS WITH HEIGHTENED INTONATION!!  =======================================================  Tongue exercise - 10 times back and forth with a straw from corner to corner, twice a day

## 2020-02-05 DIAGNOSIS — Z0289 Encounter for other administrative examinations: Secondary | ICD-10-CM

## 2020-02-12 ENCOUNTER — Telehealth: Payer: Self-pay

## 2020-02-12 NOTE — Telephone Encounter (Signed)
FMLA done for pts mom  for hospital stay and for therapy appts and neurological appts. Fee paid. Given to [facility] for processing.

## 2020-02-13 ENCOUNTER — Telehealth: Payer: Self-pay | Admitting: *Deleted

## 2020-02-13 ENCOUNTER — Other Ambulatory Visit: Payer: Self-pay

## 2020-02-13 ENCOUNTER — Ambulatory Visit: Payer: BC Managed Care – PPO

## 2020-02-13 ENCOUNTER — Ambulatory Visit: Payer: BC Managed Care – PPO | Attending: Neurology | Admitting: Occupational Therapy

## 2020-02-13 VITALS — BP 121/74 | HR 107

## 2020-02-13 DIAGNOSIS — R278 Other lack of coordination: Secondary | ICD-10-CM | POA: Insufficient documentation

## 2020-02-13 DIAGNOSIS — R2689 Other abnormalities of gait and mobility: Secondary | ICD-10-CM

## 2020-02-13 DIAGNOSIS — M6281 Muscle weakness (generalized): Secondary | ICD-10-CM | POA: Diagnosis not present

## 2020-02-13 DIAGNOSIS — R471 Dysarthria and anarthria: Secondary | ICD-10-CM | POA: Insufficient documentation

## 2020-02-13 DIAGNOSIS — R41841 Cognitive communication deficit: Secondary | ICD-10-CM | POA: Diagnosis not present

## 2020-02-13 NOTE — Patient Instructions (Signed)
  1. Grip Strengthening (Resistive Putty)   Squeeze putty using thumb and all fingers. Repeat _20___ times. Do __2__ sessions per day.   2. Roll putty into tube on table and pinch between first two fingers and thumb x 10 reps each. Do 2 sessions per day    Coordination Activities  Perform the following activities for 15 minutes 2 times per day with left hand(s).   Rotate ball in fingertips (clockwise and counter-clockwise x 3-5 revolutions each way).  Juggle 2 balls.  Deal cards with your thumb (Hold deck in hand and push card off top with thumb).  Rotate one card in hand (clockwise and counter-clockwise).  Pick up coins one at a time until you get 5 in your hand, then move coins from palm to fingertips to stack one at a time. Do 3 stacks of 5

## 2020-02-13 NOTE — Therapy (Signed)
[facility] [address] [place], [place], [address] Phone: [phone]   Fax:  [phone]  Occupational Therapy Evaluation  Patient Details  Name: Glenda Hines MRN: [id number] Date of Birth: [birth year] Referring Provider (OT): Dr. Leonie Man   Encounter Date: 02/13/2020  OT End of Session - 02/13/20 1327    Visit Number  1    Number of Visits  4    Date for OT Re-Evaluation  03/15/20    Authorization Type  BC/BS    OT Start Time  [id number]    OT Stop Time  1320    OT Time Calculation (min)  45 min       Past Medical History:  Diagnosis Date  . Pituitary tumor   . SVT (supraventricular tachycardia) (HCC)     Past Surgical History:  Procedure Laterality Date  . BUBBLE STUDY  01/23/2020   Procedure: BUBBLE STUDY;  Surgeon: Josue Hector, MD;  Location: [facility] ENDOSCOPY;  Service: Cardiovascular;;  . catheter ablation    . IR CT HEAD LTD  01/21/2020  . IR PERCUTANEOUS ART THROMBECTOMY/INFUSION INTRACRANIAL INC DIAG ANGIO  01/21/2020  . KNEE SURGERY    . RADIOLOGY WITH ANESTHESIA N/A 01/21/2020   Procedure: RADIOLOGY WITH ANESTHESIA;  Surgeon: Radiologist, Medication, MD;  Location: [facility];  Service: Radiology;  Laterality: N/A;  . TEE WITHOUT CARDIOVERSION N/A 01/23/2020   Procedure: TRANSESOPHAGEAL ECHOCARDIOGRAM (TEE);  Surgeon: Josue Hector, MD;  Location: [facility] ENDOSCOPY;  Service: Cardiovascular;  Laterality: N/A;  . TONSILLECTOMY      There were no vitals filed for this visit.  Subjective Assessment - 02/13/20 1242    Patient is accompanied by:  Family member   mother   Pertinent History  Rt CVA 01/21/20. PMH: SVT w/ ablation, pituaitary tumor, Rt knee surgeries    Limitations  no heavy lifting    Currently in Pain?  No/denies        [facility] OT Assessment - 02/13/20 0001      Assessment   Medical Diagnosis  Rt MCA CVA    Referring Provider (OT)  Dr. 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)    Leisure  hang out with her kids, learning to box, shooting      ADL   Eating/Feeding  Independent    Grooming  Independent    Scientist, clinical (histocompatibility and immunogenetics)  Independent    Lower Body Bathing  Independent    Upper Body Dressing  Independent    Lower Body Dressing  Independent    Banker - Astronomer -  Forensic scientist  IADL   Shopping  Shops independently for small purchases   with someone driving her   Light Housekeeping  Performs light daily tasks such as dishwashing, bed making    Meal Prep  Plans, prepares and serves adequate meals independently    Chief Operating Officer on family or friends for transportation   currently   Medication Management  Is responsible for taking medication in correct dosages at correct time    Financial Management  --   husband did primarily     Mobility   Mobility Status  Independent      Written Expression   Dominant Hand  Right    Handwriting  --   denies change     Vision - History   Baseline Vision  No visual deficits    Additional Comments  resolved diplopia since stroke      Cognition   Overall Cognitive Status  Within Functional Limits for tasks assessed      Observation/Other Assessments   Observations  mild FMC deficits, however pt reports no problems w/ tying shoes, buttons, childcare needs, donning/doffing jewelry       Sensation   Light Touch  Appears Intact      Coordination   9 Hole Peg Test  Right;Left    Right 9 Hole Peg Test  20.97 sec    Left 9 Hole Peg Test  25.40 sec      Edema   Edema  NONE      ROM / Strength   AROM / PROM / Strength  AROM;Strength      AROM   Overall AROM Comments  BUE AROM WNL's      Strength   Overall Strength Comments  RUE MMT 5/5, LUE MMT 4+/5 grossly      Hand Function   Right Hand Grip (lbs)  79 lbs    Left Hand Grip (lbs)  59 lbs                      OT Education - 02/13/20 1308    Education Details  Putty HEP, Coordination HEP    Person(s) Educated  Patient;Parent(s)    Methods  Explanation;Demonstration;Handout    Comprehension  Verbalized understanding;Returned demonstration          OT Long Term Goals - 02/13/20 1337      OT LONG TERM GOAL #1   Title  Independent with HEP's for coordination, shoulder and grip strength    Time  4    Period  Weeks    Status  New      OT LONG TERM GOAL #2   Title  Improve coordination Lt hand as evidenced by performing 9 hole peg test in 22 sec or less    Baseline  25.40 sec    Time  4    Period  Weeks    Status  New      OT LONG TERM GOAL #3   Title  Improve grip strength Lt hand to 65 lbs or greater to assist in opening tight jars and return to leisure activities    Baseline  59 lbs (Rt = 79 lbs)    Time  4    Period  Weeks    Status  New            Plan - 02/13/20 1328    Clinical Impression Statement  Pt is a 34 y.o. 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 Pt would benefit from short amount of O.T. to address these deficits.    OT Occupational Profile and History  Problem Focused Assessment - Including review of records relating to presenting problem    Occupational performance deficits (Please refer to evaluation for details):  IADL's;Leisure;Social Participation    Body Structure / Function /  Physical Skills  FMC;Coordination;UE functional use;Endurance;Strength;IADL    Rehab Potential  Excellent    Clinical Decision Making  Limited treatment options, no task modification necessary    Comorbidities Affecting Occupational Performance:  None    Modification or Assistance to Complete Evaluation   No modification of tasks or assist necessary to complete eval    OT Frequency  1x / week    OT Duration  4 weeks    OT Treatment/Interventions  Therapeutic activities;Manual Therapy;DME and/or AE instruction;Patient/family education;Therapeutic exercise    Plan  review putty and coordination HEP prn, theraband HEP for LUE    Consulted and Agree with Plan of Care  Patient       Patient will benefit from skilled therapeutic intervention in order to improve the following deficits and impairments:   Body Structure / Function / Physical Skills: [facility], Coordination, UE functional use, Endurance, Strength, IADL       Visit Diagnosis: Other lack of coordination  Muscle weakness (generalized)    Problem List Patient Active Problem List   Diagnosis Date Noted  . PFO (patent foramen ovale)   . Left-sided weakness   . Cryptogenic stroke ([place]) 01/21/2020    Carey Bullocks, OTR/L 02/13/2020, 1:42 PM  [facility] [address] [place], [place], [address] Phone: [phone]   Fax:  [phone]  Name: Glenda Hines MRN: [id number] Date of Birth: 10-17-1985

## 2020-02-13 NOTE — Telephone Encounter (Signed)
Pt fmla form faxed to [phone] attn Worthy Keeler

## 2020-02-13 NOTE — Therapy (Signed)
[facility] [address] [place], [place], [address] Phone: [phone]   Fax:  [phone]  Physical Therapy Treatment  Patient Details  Name: Glenda Hines MRN: [id number] Date of Birth: 1986/01/22 Referring Provider (PT): Antony Contras   Encounter Date: 02/13/2020  PT End of Session - 02/13/20 1207    Visit Number  3    Number of Visits  4    Date for PT Re-Evaluation  02/29/20    PT Start Time  [id number]   patient arriving late   PT Stop Time  1233    PT Time Calculation (min)  36 min    Activity Tolerance  Patient tolerated treatment well    Behavior During Therapy  [facility] for tasks assessed/performed       Past Medical History:  Diagnosis Date  . Pituitary tumor   . SVT (supraventricular tachycardia) (HCC)     Past Surgical History:  Procedure Laterality Date  . BUBBLE STUDY  01/23/2020   Procedure: BUBBLE STUDY;  Surgeon: Josue Hector, MD;  Location: [facility] ENDOSCOPY;  Service: Cardiovascular;;  . catheter ablation    . IR CT HEAD LTD  01/21/2020  . IR PERCUTANEOUS ART THROMBECTOMY/INFUSION INTRACRANIAL INC DIAG ANGIO  01/21/2020  . KNEE SURGERY    . RADIOLOGY WITH ANESTHESIA N/A 01/21/2020   Procedure: RADIOLOGY WITH ANESTHESIA;  Surgeon: Radiologist, Medication, MD;  Location: [facility];  Service: Radiology;  Laterality: N/A;  . TEE WITHOUT CARDIOVERSION N/A 01/23/2020   Procedure: TRANSESOPHAGEAL ECHOCARDIOGRAM (TEE);  Surgeon: Josue Hector, MD;  Location: [facility] ENDOSCOPY;  Service: Cardiovascular;  Laterality: N/A;  . TONSILLECTOMY      Vitals:   02/13/20 1206 02/13/20 1213  BP: 120/75 121/74  Pulse: 85 (!) 107    Subjective Assessment - 02/13/20 1200    Subjective  Patient reports no new complaints since last session. No reports of headache and lightheadedness. Did walk about 20 minutes at home, with no issues.    Patient Stated Goals  Pt wants to be able to move return to prior function which was caring  for children and getting back to exercise. Pt also wants to improve her movement in face.    Currently in Pain?  No/denies                       [facility] Adult PT Treatment/Exercise - 02/13/20 1208      Ambulation/Gait   Ambulation/Gait  Yes    Ambulation/Gait Assistance  7: Independent    Gait Comments  PT ambulated on treadmill x 10 min with 2 min warm up and cool down at 1.3 mph and 2.1 mph during the other time. Normal vital response with exercise. Patient demo improved proper step length and gait pattern during ambulation on treadmill.       Neuro Re-ed    Neuro Re-ed Details   Completed balance activities on BOSU: including R/L SLS with light to no UE support, 3 x  25-45 seconds. completed standing balance on rockerboard (positoned ant/post) with focus on holding board steady, eyes closed 3 x 45 sec - 1 minute.       Exercises   Exercises  Other Exercises    Other Exercises   In // bars on Bosu: completed squats 2 x 10 reps, and alternating step up with opposite knee drive 2 x 10 reps. All completed with light UE support as needed.  PT Short Term Goals - 01/30/20 1946      PT SHORT TERM GOAL #1   Title  STG=LTGs    Time  --    Period  --    Status  --    Target Date  --      PT SHORT TERM GOAL #2   Title  --    Time  --    Period  --    Status  --    Target Date  --      PT SHORT TERM GOAL #3   Title  --    Time  --    Period  --    Status  --    Target Date  --      PT SHORT TERM GOAL #4   Title  --    Baseline  --    Time  --    Period  --    Status  --    Target Date  --        PT Long Term Goals - 01/30/20 1951      PT LONG TERM GOAL #1   Title  Pt will be independent with HEP for strengthening and walking program to conitnue gains on own.    Time  4    Period  Weeks    Status  New    Target Date  02/29/20      PT LONG TERM GOAL #2   Title  Pt will report being able to walk 20-30 min for improved activity  tolerance and community mobility.    Time  4    Period  Weeks    Status  New    Target Date  02/29/20      PT LONG TERM GOAL #3   Title  Pt will ascend/descend  flight of stairs with reciprocal pattern without rails carrying 30# weight mimicking carrying 34 year old for improved functional strength.    Time  4    Period  Weeks    Status  New    Target Date  02/29/20      PT LONG TERM GOAL #4   Title  Pt will increase 6 min walk from 1265' to >1500' for improved activity tolerance.    Time  4    Period  Weeks    Status  New    Target Date  02/29/20            Plan - 02/13/20 1333    Clinical Impression Statement  Patient continued to tolerate all gait actvities on treadmill with normal vital response. Progressed balance/strength exercises with patient tolerating well. Patient demonstrating good progress and spoke about potential discharge at next session.    Examination-Activity Limitations  Locomotion Level;Stairs    Examination-Participation Restrictions  Community Activity;Driving    Stability/Clinical Decision Making  Stable/Uncomplicated    Rehab Potential  Excellent    PT Frequency  1x / week   plus eval   PT Duration  3 weeks    PT Treatment/Interventions  ADLs/Self Care Home Management;Functional mobility training;Stair training;Gait training;Therapeutic activities;Therapeutic exercise;Neuromuscular re-education;Patient/family education    PT Next Visit Plan  Dynamic gait, strengthening. Stair training with carrying something.    Consulted and Agree with Plan of Care  Patient;Family member/caregiver    Family Member Consulted  mom       Patient will benefit from skilled therapeutic intervention in order to improve the following deficits and impairments:  Decreased activity tolerance,  Decreased strength, Abnormal gait  Visit Diagnosis: Other abnormalities of gait and mobility  Muscle weakness (generalized)     Problem List Patient Active Problem List    Diagnosis Date Noted  . PFO (patent foramen ovale)   . Left-sided weakness   . Cryptogenic stroke ([place]) 01/21/2020    Jones Bales, PT, DPT 02/13/2020, 1:36 PM  [facility] [address] [place], [place], [address] Phone: [phone]   Fax:  [phone]  Name: Glenda Hines MRN: [id number] Date of Birth: 01-27-1986

## 2020-02-14 ENCOUNTER — Encounter: Payer: Self-pay | Admitting: Student

## 2020-02-14 ENCOUNTER — Ambulatory Visit: Payer: BC Managed Care – PPO

## 2020-02-14 ENCOUNTER — Ambulatory Visit (INDEPENDENT_AMBULATORY_CARE_PROVIDER_SITE_OTHER): Payer: BC Managed Care – PPO | Admitting: Student

## 2020-02-14 VITALS — BP 130/76 | HR 86 | Ht 69.0 in | Wt 168.0 lb

## 2020-02-14 DIAGNOSIS — R2689 Other abnormalities of gait and mobility: Secondary | ICD-10-CM | POA: Diagnosis not present

## 2020-02-14 DIAGNOSIS — I639 Cerebral infarction, unspecified: Secondary | ICD-10-CM | POA: Diagnosis not present

## 2020-02-14 DIAGNOSIS — R41841 Cognitive communication deficit: Secondary | ICD-10-CM | POA: Diagnosis not present

## 2020-02-14 DIAGNOSIS — I471 Supraventricular tachycardia: Secondary | ICD-10-CM | POA: Diagnosis not present

## 2020-02-14 DIAGNOSIS — Q211 Atrial septal defect: Secondary | ICD-10-CM

## 2020-02-14 DIAGNOSIS — Q2112 Patent foramen ovale: Secondary | ICD-10-CM

## 2020-02-14 DIAGNOSIS — R471 Dysarthria and anarthria: Secondary | ICD-10-CM

## 2020-02-14 DIAGNOSIS — R278 Other lack of coordination: Secondary | ICD-10-CM | POA: Diagnosis not present

## 2020-02-14 DIAGNOSIS — M6281 Muscle weakness (generalized): Secondary | ICD-10-CM | POA: Diagnosis not present

## 2020-02-14 NOTE — Therapy (Signed)
[facility] [address] [place], [place], [address] Phone: [phone]   Fax:  [phone]  Speech Language Pathology Treatment  Patient Details  Name: Glenda Hines MRN: [id number] Date of Birth: [birth year] Referring Provider (SLP): Royal Hawthorn., MD   Encounter Date: 02/14/2020  End of Session - 02/14/20 1101    Visit Number  3    Number of Visits  17    Date for SLP Re-Evaluation  05/01/20    SLP Start Time  1023   7 minutes late   SLP Stop Time   1056   had to leave early for MD appt   SLP Time Calculation (min)  33 min    Activity Tolerance  Patient tolerated treatment well       Past Medical History:  Diagnosis Date  . Pituitary tumor   . SVT (supraventricular tachycardia) (HCC)     Past Surgical History:  Procedure Laterality Date  . BUBBLE STUDY  01/23/2020   Procedure: BUBBLE STUDY;  Surgeon: Josue Hector, MD;  Location: [facility] ENDOSCOPY;  Service: Cardiovascular;;  . catheter ablation    . IR CT HEAD LTD  01/21/2020  . IR PERCUTANEOUS ART THROMBECTOMY/INFUSION INTRACRANIAL INC DIAG ANGIO  01/21/2020  . KNEE SURGERY    . RADIOLOGY WITH ANESTHESIA N/A 01/21/2020   Procedure: RADIOLOGY WITH ANESTHESIA;  Surgeon: Radiologist, Medication, MD;  Location: [facility];  Service: Radiology;  Laterality: N/A;  . TEE WITHOUT CARDIOVERSION N/A 01/23/2020   Procedure: TRANSESOPHAGEAL ECHOCARDIOGRAM (TEE);  Surgeon: Josue Hector, MD;  Location: [facility] ENDOSCOPY;  Service: Cardiovascular;  Laterality: N/A;  . TONSILLECTOMY      There were no vitals filed for this visit.  Subjective Assessment - 02/14/20 1027    Subjective  "I remembered my survey. (QOL measure)." Arrived 7 minutes late.    Patient is accompained by:  Family member   mother   Currently in Pain?  No/denies            ADULT SLP TREATMENT - 02/14/20 1028      General Information   Behavior/Cognition  Alert;Cooperative;Pleasant mood      Treatment  Provided   Treatment provided  Cognitive-Linquistic      Cognitive-Linquistic Treatment   Treatment focused on  Dysarthria    Skilled Treatment  Pt practiced with HEP 80% with artic and 20% on vocal variation/intonation. SLP worked with pt today on intonation with childrens' books on youtube. Pt req'd models from SLP fading to occasional mod A for pitch variation in reading. SLP told pt to have 40% pitch practice and 60% artiuclation practice until next session.       Assessment / Recommendations / Plan   Plan  Continue with current plan of care      Progression Toward Goals   Progression toward goals  Progressing toward goals       SLP Education - 02/14/20 1101    Education Details  "siren" exercise added to HEP regimen    Person(s) Educated  Patient;Parent(s)    Methods  Explanation;Demonstration;Verbal cues;Handout    Comprehension  Verbalized understanding;Returned demonstration;Verbal cues required;Need further instruction       SLP Short Term Goals - 02/14/20 1103      SLP SHORT TERM GOAL #1   Title  pt will complete HEP (pitch, articulation) with rare min A over 2 sessions    Time  3    Period  Weeks    Status  On-going  SLP SHORT TERM GOAL #2   Title  pt will demo higher pitch range than ~330Hz  in therapy tasks    Time  3    Period  Weeks    Status  On-going       SLP Long Term Goals - 02/14/20 1104      SLP LONG TERM GOAL #1   Title  pt to perform HEP for dysarthria with independence in 3 sessions    Time  7    Period  Weeks   or 17 sessions, for all LTGs   Status  On-going      SLP LONG TERM GOAL #2   Title  pt will report incr in pitch range/intonation compared to first session of ST (digital recording will be taken in first 1-2 ST sessions)    Time  7    Period  Weeks    Status  Revised      SLP LONG TERM GOAL #3   Title  pt will have further testing PRN re: difficulty expressing herself verbally    Status  Deferred      SLP LONG TERM GOAL #4    Title  pt will produce 100% intelligible speech in 25 minutees mod complex conversation x 3 sessions    Time  7    Period  Weeks    Status  On-going      SLP LONG TERM GOAL #5   Title  pt QOL will be higher than her original report in the first days of ST    Baseline  84 on 02-14-20    Time  7    Period  Weeks    Status  On-going       Plan - 02/14/20 1102    Clinical Impression Statement  Pt presents today with 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 Pt returned her speech QOL today with a score of 84. SLP worked with pt today on pitch variation in functional setting - reading to her children. See "skilled intervention" for details. Pt would cont to benefit from a program of sklled ST focusing on improving oral muscle stregnth, and increasing speech clarity in conversation.    Speech Therapy Frequency  2x / week    Duration  --   8 weeks or 17 total sessions   Treatment/Interventions  Oral motor exercises;Functional tasks;Compensatory techniques;SLP instruction and feedback;Patient/family education;Internal/external aids;Cueing hierarchy   possible voice exercises   Potential to Achieve Goals  Good    Consulted and Agree with Plan of Care  Patient       Patient will benefit from skilled therapeutic intervention in order to improve the following deficits and impairments:   Dysarthria and anarthria  Cognitive communication deficit    Problem List Patient Active Problem List   Diagnosis Date Noted  . PFO (patent foramen ovale)   . Left-sided weakness   . Cryptogenic stroke ([place]) 01/21/2020    [facility] ,[place], [place]  02/14/2020, 11:06 AM  [facility] [address] [place], [place], [address] Phone: [phone]   Fax:  [phone]   Name: Glenda Hines MRN: [id number] Date of Birth: [birth year]

## 2020-02-14 NOTE — Patient Instructions (Addendum)
   SIREN EXERCISE  - start off as low as you can - glide as high as you can, back down to as low as you can. 10x  ======================== Practice 40% with your pitch variation and 60% with the articulation

## 2020-02-14 NOTE — Patient Instructions (Addendum)
Medication Instructions:  none *If you need a refill on your cardiac medications before your next appointment, please call your pharmacy*   Lab Work: none If you have labs (blood work) drawn today and your tests are completely normal, you will receive your results only by: Marland Kitchen MyChart Message (if you have MyChart) OR . A paper copy in the mail If you have any lab test that is abnormal or we need to change your treatment, we will call you to review the results.   Testing/Procedures: none   Follow-Up: At [facility], you and your health needs are our priority.  As part of our continuing mission to provide you with exceptional heart care, we have created designated Provider Care Teams.  These Care Teams include your primary Cardiologist (physician) and Advanced Practice Providers (APPs -  Physician Assistants and Nurse Practitioners) who all work together to provide you with the care you need, when you need it.  We recommend signing up for the patient portal called "MyChart".  Sign up information is provided on this After Visit Summary.  MyChart is used to connect with patients for Virtual Visits (Telemedicine).  Patients are able to view lab/test results, encounter notes, upcoming appointments, etc.  Non-urgent messages can be sent to your provider as well.   To learn more about what you can do with MyChart, go to [email or web address].    Your next appointment:   As needed with Dr Shonna Chock   Other Instructions

## 2020-02-14 NOTE — Progress Notes (Signed)
Electrophysiology Office Note Date: 02/14/2020  ID:  Glenda Hines, DOB [birth year], MRN [id number]  PCP: Glenda Nakai, MD Primary Cardiologist: No primary care provider on file. Electrophysiologist: Glenda Meredith Leeds, MD   CC: ILR follow-up  Glenda Hines is a 34 y.o. female seen today for Glenda. Curt Bears . she presents today for routine electrophysiology followup s/p Zio patch for cryptogenic stroke.  Since last being seen in our clinic, the patient reports doing very well. She has been working with PT. Considering getting an apple watch to keep track of heart rates and to do EKGs as needed. She denies chest pain, palpitations, dyspnea, PND, orthopnea, nausea, vomiting, dizziness, syncope, edema, weight gain, or early satiety.  Past Medical History:  Diagnosis Date  . Pituitary tumor   . SVT (supraventricular tachycardia) (HCC)    Past Surgical History:  Procedure Laterality Date  . BUBBLE STUDY  01/23/2020   Procedure: BUBBLE STUDY;  Surgeon: Glenda Hector, MD;  Location: [facility] ENDOSCOPY;  Service: Cardiovascular;;  . catheter ablation    . IR CT HEAD LTD  01/21/2020  . IR PERCUTANEOUS ART THROMBECTOMY/INFUSION INTRACRANIAL INC DIAG ANGIO  01/21/2020  . KNEE SURGERY    . RADIOLOGY WITH ANESTHESIA N/A 01/21/2020   Procedure: RADIOLOGY WITH ANESTHESIA;  Surgeon: Glenda Hines, Medication, MD;  Location: [place];  Service: Radiology;  Laterality: N/A;  . TEE WITHOUT CARDIOVERSION N/A 01/23/2020   Procedure: TRANSESOPHAGEAL ECHOCARDIOGRAM (TEE);  Surgeon: Glenda Hector, MD;  Location: [facility] ENDOSCOPY;  Service: Cardiovascular;  Laterality: N/A;  . TONSILLECTOMY      Current Outpatient Medications  Medication Sig Dispense Refill  . aspirin EC 81 MG EC tablet Take 1 tablet (81 mg total) by mouth daily.    Marland Kitchen atorvastatin (LIPITOR) 40 MG tablet Take 1 tablet (40 mg total) by mouth daily. 30 tablet 1  . clopidogrel (PLAVIX) 75 MG tablet Take 1 tablet (75 mg total) by mouth daily. 21 tablet 0    No current facility-administered medications for this visit.    Allergies:   Oxycodone, Vancomycin, Squid oil, Sulfa antibiotics, and Tape   Social History: Social History   Socioeconomic History  . Marital status: Married    Spouse name: Not on file  . Number of children: Not on file  . Years of education: Not on file  . Highest education level: Not on file  Occupational History  . Not on file  Tobacco Use  . Smoking status: Never Smoker  . Smokeless tobacco: Never Used  Substance and Sexual Activity  . Alcohol use: No  . Drug use: No  . Sexual activity: Never  Other Topics Concern  . Not on file  Social History Narrative  . Not on file   Social Determinants of Health   Financial Resource Strain:   . Difficulty of Paying Living Expenses:   Food Insecurity:   . Worried About Charity fundraiser in the Last Year:   . Arboriculturist in the Last Year:   Transportation Needs:   . Film/video editor (Medical):   Marland Kitchen Lack of Transportation (Non-Medical):   Physical Activity:   . Days of Exercise per Week:   . Minutes of Exercise per Session:   Stress:   . Feeling of Stress :   Social Connections:   . Frequency of Communication with Friends and Family:   . Frequency of Social Gatherings with Friends and Family:   . Attends Religious Services:   . Active Member of Clubs  or Organizations:   . Attends Archivist Meetings:   Marland Kitchen Marital Status:   Intimate Partner Violence:   . Fear of Current or Ex-Partner:   . Emotionally Abused:   Marland Kitchen Physically Abused:   . Sexually Abused:     Family History: Family History  Problem Relation Age of Onset  . Hypertension Mother   . Diabetes Father      Review of Systems: All other systems reviewed and are otherwise negative except as noted above.  Physical Exam: Vitals:   02/14/20 1122  BP: 130/76  Pulse: 86  SpO2: 99%  Weight: 168 lb (76.2 kg)  Height: 5\' 9"  (1.753 m)     GEN- The patient is well  appearing, alert and oriented x 3 today.   HEENT: normocephalic, atraumatic; sclera clear, conjunctiva pink; hearing intact; oropharynx clear; neck supple  Lungs- Clear to ausculation bilaterally, normal work of breathing.  No wheezes, rales, rhonchi Heart- Regular rate and rhythm, no murmurs, rubs or gallops  GI- soft, non-tender, non-distended, bowel sounds present  Extremities- no clubbing, cyanosis, or edema  MS- no significant deformity or atrophy Skin- warm and dry, no rash or lesion; PPM pocket well healed Psych- euthymic mood, full affect Neuro- strength and sensation are intact  PPM Interrogation- reviewed in detail today,  See PACEART report  EKG:  EKG is ordered today. The ekg ordered today shows NSR at 86 bpm with normal intervals.  Recent Labs: 01/21/2020: ALT 15; BUN 11; Creatinine, Ser 0.70; Hemoglobin 12.2; Platelets 273; Potassium 3.7; Sodium 140   Wt Readings from Last 3 Encounters:  02/14/20 168 lb (76.2 kg)  01/21/20 184 lb 15.5 oz (83.9 kg)  09/09/12 155 lb 6.4 oz (70.5 kg)     Other studies Reviewed: Additional studies/ records that were reviewed today include: Echo TEE 01/23/2020 shows LVEF 60-65% with small PFO/positive bubble study, Previous EP notes,  Most recent labwork.   Assessment and Plan:  1. Cryptogenic Stroke  Zio patch with formal read pending, but no AF noted. Sinus tachycardia noted, confirmed with patient these episodes were in the setting of physical therapy.  She would prefer to proceed with hypercoagulable work up and PFO consult prior to considering loop recorder further. She understands that Atrial fibrillation may be asymptomatic and could be missed without long term monitoring.  She Glenda call us back if she decides to proceed with loop recorder  2. PFO Noted on TEE She sees Glenda Hines 02/21/2020. Encouraged to keep follow up. For this patient's PFO, there is a moderate likelihood of contribution to stroke/TIA with a RoPE score of 4 to 6  (6 by my assessment)  Above score calculated as 1 point each if NOT present [HTN, DM2, h/o CVA/TIA, Smoker, Cortical imaging on infarct] Above score calculated as 5 points if [Age 73 to 29], 4 points if [Age 45 to 39], 3 points if [Age 66 to 49], 2 points if [Age 51 to 59], 1 point if [Age 59 to 69], 0 points if [Age > 70]  RoPE score is total sum of individual points (higher total is LESS risk of stroke)  3. H/o SVT s/p ablation No recurrent to her knowledge s/p ablation 2013.  Current medicines are reviewed at length with the patient today.   The patient does not have concerns regarding her medicines.  The following changes were made today:  none  Labs/ tests ordered today include:  No orders of the defined types were placed in this encounter.  Disposition:   Follow up with Glenda. Curt Bears as needed. She knows to call back if she decides to proceed with ILR placement. She understands that without long term monitoring, she may have undiagnosed atrial fibrillation which could potentially cause recurrent stroke.   Jacalyn Lefevre, PA-C  02/14/2020 12:42 PM  [place] [address] [phone] (office) [phone] (fax)

## 2020-02-18 ENCOUNTER — Telehealth: Payer: Self-pay | Admitting: Cardiovascular Disease

## 2020-02-18 NOTE — Telephone Encounter (Signed)
I spoke to the patient's mother and she will need to accompany the patient to her upcoming appointment with Dr Burt Knack.

## 2020-02-18 NOTE — Telephone Encounter (Signed)
  Marye Round, mother of the patient, will be coming with Ms Sturgell to her appointment to assist her.

## 2020-02-20 ENCOUNTER — Other Ambulatory Visit: Payer: Self-pay

## 2020-02-20 ENCOUNTER — Ambulatory Visit: Payer: BC Managed Care – PPO

## 2020-02-20 DIAGNOSIS — R2689 Other abnormalities of gait and mobility: Secondary | ICD-10-CM | POA: Diagnosis not present

## 2020-02-20 DIAGNOSIS — R278 Other lack of coordination: Secondary | ICD-10-CM | POA: Diagnosis not present

## 2020-02-20 DIAGNOSIS — R471 Dysarthria and anarthria: Secondary | ICD-10-CM

## 2020-02-20 DIAGNOSIS — R41841 Cognitive communication deficit: Secondary | ICD-10-CM | POA: Diagnosis not present

## 2020-02-20 DIAGNOSIS — M6281 Muscle weakness (generalized): Secondary | ICD-10-CM | POA: Diagnosis not present

## 2020-02-20 NOTE — Therapy (Signed)
[facility] [address] [place], [place], [address] Phone: [phone]   Fax:  [phone]  Speech Language Pathology Treatment  Patient Details  Name: Glenda Hines MRN: [id number] Date of Birth: 09-21-1986 Referring Provider (SLP): Royal Hawthorn., MD   Encounter Date: 02/20/2020  End of Session - 02/20/20 1750    Visit Number  4    Number of Visits  17    Date for SLP Re-Evaluation  05/01/20    SLP Start Time  [id number]    SLP Stop Time   1445    SLP Time Calculation (min)  40 min    Activity Tolerance  Patient tolerated treatment well       Past Medical History:  Diagnosis Date  . Pituitary tumor   . SVT (supraventricular tachycardia) (HCC)     Past Surgical History:  Procedure Laterality Date  . BUBBLE STUDY  01/23/2020   Procedure: BUBBLE STUDY;  Surgeon: Josue Hector, MD;  Location: [facility] ENDOSCOPY;  Service: Cardiovascular;;  . catheter ablation    . IR CT HEAD LTD  01/21/2020  . IR PERCUTANEOUS ART THROMBECTOMY/INFUSION INTRACRANIAL INC DIAG ANGIO  01/21/2020  . KNEE SURGERY    . RADIOLOGY WITH ANESTHESIA N/A 01/21/2020   Procedure: RADIOLOGY WITH ANESTHESIA;  Surgeon: Radiologist, Medication, MD;  Location: [place];  Service: Radiology;  Laterality: N/A;  . TEE WITHOUT CARDIOVERSION N/A 01/23/2020   Procedure: TRANSESOPHAGEAL ECHOCARDIOGRAM (TEE);  Surgeon: Josue Hector, MD;  Location: [facility] ENDOSCOPY;  Service: Cardiovascular;  Laterality: N/A;  . TONSILLECTOMY      There were no vitals filed for this visit.         ADULT SLP TREATMENT - 02/20/20 1746      General Information   Behavior/Cognition  Alert;Cooperative;Pleasant mood      Treatment Provided   Treatment provided  Cognitive-Linquistic      Cognitive-Linquistic Treatment   Treatment focused on  Dysarthria    Skilled Treatment  Pt with notable changes (positive) with pitch variation today. HEP for artic completed with rare min A. Taught  pt lingual isometrics today (tongue to cheek- with resistance, tongue protrusion and retraction), per her request. Having difficulty with lateral straw manipulation.        Assessment / Recommendations / Plan   Plan  Continue with current plan of care   consider x1/week     Progression Toward Goals   Progression toward goals  Progressing toward goals       SLP Education - 02/20/20 1749    Education Details  lingual isometrics, protrusion/retraction    Person(s) Educated  Patient    Methods  Explanation;Demonstration;Verbal cues;Handout    Comprehension  Verbalized understanding;Verbal cues required;Returned demonstration       SLP Short Term Goals - 02/20/20 1421      SLP SHORT TERM GOAL #1   Title  pt will complete HEP (pitch, articulation) with rare min A over 2 sessions    Baseline  02-20-20    Time  2    Period  Weeks    Status  On-going      SLP SHORT TERM GOAL #2   Title  pt will demo higher pitch range than ~330Hz  in therapy tasks    Time  2    Period  Weeks    Status  On-going       SLP Long Term Goals - 02/20/20 1434      SLP LONG TERM GOAL #1  Title  pt to perform HEP for dysarthria with independence in 3 sessions    Time  6    Period  Weeks   or 17 sessions, for all LTGs   Status  On-going      SLP LONG TERM GOAL #2   Title  pt will report incr in pitch range/intonation compared to first session of ST (digital recording will be taken in first 1-2 ST sessions)    Time  6    Period  Weeks    Status  Revised      SLP LONG TERM GOAL #3   Title  pt will have further testing PRN re: difficulty expressing herself verbally    Status  Deferred      SLP LONG TERM GOAL #4   Title  pt will produce 100% intelligible speech in 25 minutees mod complex conversation x 3 sessions    Time  6    Period  Weeks    Status  On-going      SLP LONG TERM GOAL #5   Title  pt QOL will be higher than her original report in the first days of ST    Baseline  84 on 02-14-20     Time  6    Period  Weeks    Status  On-going       Plan - 02/20/20 1750    Clinical Impression Statement  Pt presents today with 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 SLP taught pt lingual isometrics today (per her request). See "skilled intervention" for details. Pt would cont to benefit from a program of sklled ST focusing on improving oral muscle stregnth, and increasing speech clarity in conversation.    Speech Therapy Frequency  2x / week    Duration  --   8 weeks or 17 total sessions   Treatment/Interventions  Oral motor exercises;Functional tasks;Compensatory techniques;SLP instruction and feedback;Patient/family education;Internal/external aids;Cueing hierarchy   possible voice exercises   Potential to Achieve Goals  Good    Consulted and Agree with Plan of Care  Patient       Patient will benefit from skilled therapeutic intervention in order to improve the following deficits and impairments:   Dysarthria and anarthria    Problem List Patient Active Problem List   Diagnosis Date Noted  . PFO (patent foramen ovale)   . Left-sided weakness   . Cryptogenic stroke ([place]) 01/21/2020    [facility] ,[place], [place]  02/20/2020, 5:51 PM  [facility] [address] [place], [place], [address] Phone: [phone]   Fax:  [phone]   Name: Glenda Hines MRN: [id number] Date of Birth: 10/08/1986

## 2020-02-20 NOTE — Patient Instructions (Signed)
  Push tongue to left cheek and press with fingers for 5 seconds, then to right cheek. 10-15 times, twice a day Protrude tongue as far as you can and then retract tongue as far back as you can - 10-15 times, twice a day

## 2020-02-21 ENCOUNTER — Ambulatory Visit (INDEPENDENT_AMBULATORY_CARE_PROVIDER_SITE_OTHER): Payer: BC Managed Care – PPO | Admitting: Cardiovascular Disease

## 2020-02-21 ENCOUNTER — Ambulatory Visit: Payer: BC Managed Care – PPO

## 2020-02-21 ENCOUNTER — Encounter: Payer: Self-pay | Admitting: Cardiovascular Disease

## 2020-02-21 ENCOUNTER — Ambulatory Visit: Payer: Self-pay | Admitting: Cardiovascular Disease

## 2020-02-21 VITALS — BP 118/74 | HR 89 | Ht 69.0 in | Wt 168.6 lb

## 2020-02-21 DIAGNOSIS — R471 Dysarthria and anarthria: Secondary | ICD-10-CM

## 2020-02-21 DIAGNOSIS — M6281 Muscle weakness (generalized): Secondary | ICD-10-CM | POA: Diagnosis not present

## 2020-02-21 DIAGNOSIS — Q2112 Patent foramen ovale: Secondary | ICD-10-CM

## 2020-02-21 DIAGNOSIS — Q211 Atrial septal defect: Secondary | ICD-10-CM | POA: Diagnosis not present

## 2020-02-21 DIAGNOSIS — R2689 Other abnormalities of gait and mobility: Secondary | ICD-10-CM | POA: Diagnosis not present

## 2020-02-21 DIAGNOSIS — R278 Other lack of coordination: Secondary | ICD-10-CM | POA: Diagnosis not present

## 2020-02-21 DIAGNOSIS — R41841 Cognitive communication deficit: Secondary | ICD-10-CM | POA: Diagnosis not present

## 2020-02-21 NOTE — Therapy (Signed)
[facility] [address] [place], [place], [address] Phone: [phone]   Fax:  [phone]  Physical Therapy Treatment/ Discharge Summary  Patient Details  Name: Glenda Hines MRN: [id number] Date of Birth: [birth year] Referring Provider (PT): [place] SUMMARY  Visits from Start of Care: 4  Current functional level related to goals / functional outcomes: See clinical impression statement and LTG's.   Remaining deficits: None   Education / Equipment: Educated on [employer] and continuing walking program at home.   Plan: Patient agrees to discharge.  Patient goals were met. Patient is being discharged due to meeting the stated rehab goals.  ?????        Encounter Date: 02/21/2020  PT End of Session - 02/21/20 1539    Visit Number  4    Number of Visits  4    Date for PT Re-Evaluation  02/29/20    PT Start Time  1400    PT Stop Time  1446    PT Time Calculation (min)  46 min    Activity Tolerance  Patient tolerated treatment well    Behavior During Therapy  [facility] for tasks assessed/performed       Past Medical History:  Diagnosis Date  . Pituitary tumor   . SVT (supraventricular tachycardia) (HCC)     Past Surgical History:  Procedure Laterality Date  . BUBBLE STUDY  01/23/2020   Procedure: BUBBLE STUDY;  Surgeon: Josue Hector, MD;  Location: [facility] ENDOSCOPY;  Service: Cardiovascular;;  . catheter ablation    . IR CT HEAD LTD  01/21/2020  . IR PERCUTANEOUS ART THROMBECTOMY/INFUSION INTRACRANIAL INC DIAG ANGIO  01/21/2020  . KNEE SURGERY    . RADIOLOGY WITH ANESTHESIA N/A 01/21/2020   Procedure: RADIOLOGY WITH ANESTHESIA;  Surgeon: Radiologist, Medication, MD;  Location: [place];  Service: Radiology;  Laterality: N/A;  . TEE WITHOUT CARDIOVERSION N/A 01/23/2020   Procedure: TRANSESOPHAGEAL ECHOCARDIOGRAM (TEE);  Surgeon: Josue Hector, MD;  [facility] ENDOSCOPY;  Service:  Cardiovascular;  Laterality: N/A;  . TONSILLECTOMY      There were no vitals filed for this visit.  Subjective Assessment - 02/21/20 1404    Subjective  Patient reports that had appt with cardiologist about monitor, everything was normal. Minimal headaches. Patient reports walking 20-30 minutes at home, no issues.    Patient Stated Goals  Pt wants to be able to move return to prior function which was caring for children and getting back to exercise. Pt also wants to improve her movement in face.    Currently in Pain?  No/denies                [facility] Adult PT Treatment/Exercise - 02/21/20 1537      Ambulation/Gait   Ambulation/Gait  Yes    Ambulation/Gait Assistance  7: Independent    Ambulation/Gait Assistance Details  indepent, no rest breaks required. normal HR response     Ambulation Distance (Feet)  1645 Feet    Assistive device  None    Gait Pattern  Step-through pattern    Ambulation Surface  Level;Indoor    Stairs  Yes    Stairs Assistance  7: Independent    Stair Management Technique  No rails;Alternating pattern   carrying approx 30# ascending/descending.    Number of Stairs  12    Height of Stairs  6    Gait Comments  Completed 6MWT, pt ambulating 1645 ft. RPE at end 1/10. HR  33      Self-Care   Self-Care  Other Self-Care Comments    Other Self-Care Comments   Pt. encouraged to continue to walking program at home for improved endurance. With all activties, educated pt to not let RPE get higher than 4-5/10 at this time until pt recieves further clearnace from MD.       Exercises   Exercises  Other Exercises    Other Exercises   Complete review of HEP.       Completed the following exercises (as included on patients HEP). Patient demonstrates proper form with completion off therapeutic exercises as this time.   Lateral Lunge: 2 sets - 10 reps (educated to complete on BOSU)  Runner's Step Up on BOSU Ball: 2 sets - 10 reps Squat on [place] [employer]: 2 sets - 10  reps Lateral Monster Walk with Squat and Resistance: 2 sets - 10 reps Squat Jumps: 2 sets - 10 reps Walking Forward Lunge:  2 sets - 10 reps        PT Education - 02/21/20 1553    Education Details  Instructed on HEP and walking program. Continue to progress walking program with increased time as tolerated.    Person(s) Educated  Patient;Parent(s)    Methods  Explanation;Demonstration;Handout    Comprehension  Verbalized understanding;Returned demonstration       PT Short Term Goals - 01/30/20 1946      PT SHORT TERM GOAL #1   Title  STG=LTGs    Time  --    Period  --    Status  --    Target Date  --      PT SHORT TERM GOAL #2   Title  --    Time  --    Period  --    Status  --    Target Date  --      PT SHORT TERM GOAL #3   Title  --    Time  --    Period  --    Status  --    Target Date  --      PT SHORT TERM GOAL #4   Title  --    Baseline  --    Time  --    Period  --    Status  --    Target Date  --        PT Long Term Goals - 02/21/20 1406      PT LONG TERM GOAL #1   Title  Pt will be independent with HEP for strengthening and walking program to conitnue gains on own.    Baseline  Pt reports independence and being comfortable with HEP and walking program.    Time  4    Period  Weeks    Status  Achieved      PT LONG TERM GOAL #2   Title  Pt will report being able to walk 20-30 min for improved activity tolerance and community mobility.    Baseline  Pt reports currently walking 20-30 minutes w/o issues.    Time  4    Period  Weeks    Status  Achieved      PT LONG TERM GOAL #3   Title  Pt will ascend/descend flight of stairs with reciprocal pattern without rails carrying 30# weight mimicking carrying 34 year old for improved functional strength.    Baseline  Pt able to ascend/descend stairs w/ reciprocal pattern carrying approx 30#.  Time  4    Period  Weeks    Status  Achieved      PT LONG TERM GOAL #4   Title  Pt will increase 6 min  walk from 1265' to >1500' for improved activity tolerance.    Baseline  1645 ft on 5/14    Time  4    Period  Weeks    Status  Achieved            Plan - 02/21/20 1550    Clinical Impression Statement  Today's skilled PT session included assessment of all LTG's with patient meeting all appropriately. Designed nad provided a HEP targeted at further strengthening activties at home and continued encouragement of walking program upon discharge. Educated to avoid letting RPE get above 4-5/10 when completing actvities and to monitor HR response. Overall patient has demontsrated great strides with therapy and is ready for discharge, with pt and mother verablizing agreement.    Examination-Activity Limitations  Locomotion Level;Stairs    Examination-Participation Restrictions  Community Activity;Driving    Stability/Clinical Decision Making  Stable/Uncomplicated    Rehab Potential  Excellent    PT Frequency  1x / week   plus eval   PT Duration  3 weeks    PT Treatment/Interventions  ADLs/Self Care Home Management;Functional mobility training;Stair training;Gait training;Therapeutic activities;Therapeutic exercise;Neuromuscular re-education;Patient/family education    Consulted and Agree with Plan of Care  Patient;Family member/caregiver    Family Member Consulted  mom       Patient will benefit from skilled therapeutic intervention in order to improve the following deficits and impairments:  Decreased activity tolerance, Decreased strength, Abnormal gait  Visit Diagnosis: Other abnormalities of gait and mobility  Muscle weakness (generalized)  Other lack of coordination     Problem List Patient Active Problem List   Diagnosis Date Noted  . PFO (patent foramen ovale)   . Left-sided weakness   . Cryptogenic stroke ([place]) 01/21/2020    Jones Bales, PT, DPT 02/21/2020, 3:55 PM  [facility] [address] [place], [place], [address] Phone: [phone]   Fax:  [phone]  Name: Addis Bennie MRN: [id number] Date of Birth: [birth year]

## 2020-02-21 NOTE — Patient Instructions (Signed)
URL: [email or web address] Date: 02/21/2020 Prepared by: Baldomero Lamy  Exercises Lateral Lunge - 1 x daily - 7 x weekly - 2 sets - 10 reps Runner's Step Up on BOSU Ball - 1 x daily - 7 x weekly - 2 sets - 10 reps Squat on BOSU Ball - 1 x daily - 7 x weekly - 2 sets - 10 reps Lateral Monster Walk with Squat and Resistance - 1 x daily - 7 x weekly - 2 sets - 10 reps Squat Jumps - 1 x daily - 7 x weekly - 2 sets - 10 reps Walking Forward Lunge - 1 x daily - 7 x weekly - 2 sets - 10 reps

## 2020-02-21 NOTE — Patient Instructions (Addendum)
It was nice to meet you!  Please call if you wish to proceed with closure. Burt Ek [phone]

## 2020-02-21 NOTE — Therapy (Signed)
[facility] [address] [place], [place], [address] Phone: [phone]   Fax:  [phone]  Speech Language Pathology Treatment  Patient Details  Name: Glenda Hines MRN: [id number] Date of Birth: Mar 04, 1986 Referring Provider (SLP): Royal Hawthorn., MD   Encounter Date: 02/21/2020  End of Session - 02/21/20 1534    Visit Number  5    Number of Visits  17    Date for SLP Re-Evaluation  05/01/20    SLP Start Time  1325    SLP Stop Time   1400    SLP Time Calculation (min)  35 min    Activity Tolerance  Patient tolerated treatment well       Past Medical History:  Diagnosis Date  . Pituitary tumor   . SVT (supraventricular tachycardia) (HCC)     Past Surgical History:  Procedure Laterality Date  . BUBBLE STUDY  01/23/2020   Procedure: BUBBLE STUDY;  Surgeon: Josue Hector, MD;  Location: [facility] ENDOSCOPY;  Service: Cardiovascular;;  . catheter ablation    . IR CT HEAD LTD  01/21/2020  . IR PERCUTANEOUS ART THROMBECTOMY/INFUSION INTRACRANIAL INC DIAG ANGIO  01/21/2020  . KNEE SURGERY    . RADIOLOGY WITH ANESTHESIA N/A 01/21/2020   Procedure: RADIOLOGY WITH ANESTHESIA;  Surgeon: Radiologist, Medication, MD;  Location: [facility];  Service: Radiology;  Laterality: N/A;  . TEE WITHOUT CARDIOVERSION N/A 01/23/2020   Procedure: TRANSESOPHAGEAL ECHOCARDIOGRAM (TEE);  Surgeon: Josue Hector, MD;  Location: [facility] ENDOSCOPY;  Service: Cardiovascular;  Laterality: N/A;  . TONSILLECTOMY      There were no vitals filed for this visit.  Subjective Assessment - 02/21/20 1530    Subjective  Arrived 10 minutes late.    Currently in Pain?  No/denies            ADULT SLP TREATMENT - 02/21/20 1531      General Information   Behavior/Cognition  Alert;Cooperative;Pleasant mood      Treatment Provided   Treatment provided  Cognitive-Linquistic      Cognitive-Linquistic Treatment   Treatment focused on  Dysarthria    Skilled  Treatment  Pt brought her books to practice pitch variation/change. Pt with much difficulty going lower in pitch than higher in pitch. Pt reports she is nervous to read in front of SLP. SLP attempted to calm pt as much as possible - pt req'd mod A usually for pitch vriation in first reading of book, and faded frequency and level of cuing as session continued, to min-mod A occasionally for lower voice.       Assessment / Recommendations / Plan   Plan  Continue with current plan of care      Progression Toward Goals   Progression toward goals  Progressing toward goals         SLP Short Term Goals - 02/21/20 1535      SLP SHORT TERM GOAL #1   Title  pt will complete HEP (pitch, articulation) with rare min A over 2 sessions    Baseline  02-20-20    Time  2    Period  Weeks    Status  On-going      SLP SHORT TERM GOAL #2   Title  pt will demo higher pitch range than ~330Hz  in therapy tasks    Time  2    Period  Weeks    Status  On-going       SLP Long Term Goals - 02/21/20 1535  SLP LONG TERM GOAL #1   Title  pt to perform HEP for dysarthria with independence in 3 sessions    Time  6    Period  Weeks   or 17 sessions, for all LTGs   Status  On-going      SLP LONG TERM GOAL #2   Title  pt will report incr in pitch range/intonation compared to first session of ST (digital recording will be taken in first 1-2 ST sessions)    Time  6    Period  Weeks    Status  Revised      SLP LONG TERM GOAL #3   Title  pt will have further testing PRN re: difficulty expressing herself verbally    Status  Deferred      SLP LONG TERM GOAL #4   Title  pt will produce 100% intelligible speech in 25 minutees mod complex conversation x 3 sessions    Time  6    Period  Weeks    Status  On-going      SLP LONG TERM GOAL #5   Title  pt QOL will be higher than her original report in the first days of ST    Baseline  84 on 02-14-20    Time  6    Period  Weeks    Status  On-going        Plan - 02/21/20 1535    Clinical Impression Statement  Pt presents today with 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 SLP taught pt lingual isometrics today (per her request). See "skilled intervention" for details. Pt would cont to benefit from a program of sklled ST focusing on improving oral muscle stregnth, and increasing speech clarity in conversation.    Speech Therapy Frequency  2x / week    Duration  --   8 weeks or 17 total sessions   Treatment/Interventions  Oral motor exercises;Functional tasks;Compensatory techniques;SLP instruction and feedback;Patient/family education;Internal/external aids;Cueing hierarchy   possible voice exercises   Potential to Achieve Goals  Good    Consulted and Agree with Plan of Care  Patient       Patient will benefit from skilled therapeutic intervention in order to improve the following deficits and impairments:   Dysarthria and anarthria    Problem List Patient Active Problem List   Diagnosis Date Noted  . PFO (patent foramen ovale)   . Left-sided weakness   . Cryptogenic stroke ([place]) 01/21/2020    [facility] ,[place], [place]  02/21/2020, 3:36 PM  [facility] [address] [place], [place], [address] Phone: [phone]   Fax:  [phone]   Name: Glenda Hines MRN: [id number] Date of Birth: 1986/08/18

## 2020-02-21 NOTE — Progress Notes (Signed)
Cardiology Office Note:    Date:  02/22/2020   ID:  Glenda Hines, DOB [birth year], MRN [id number]  PCP:  Cher Nakai, MD  Cardiologist:  No primary care provider on file.  Electrophysiologist:  Will Meredith Leeds, MD   Referring MD: No ref. provider found   Chief Complaint  Patient presents with  . PFO   History of Present Illness:    Glenda Hines is a 34 y.o. female presenting for evaluation of PFO, referred by Dr Leonie Man. The patient was hospitalized after waking up with left sided weakness and facial droop on 01/21/2020. An MRI of the brain showed a R MCA infarct and multiple smaller ischemic foci. She was taken emergent for cerebral angiography and thrombectomy. The patient was felt to have an embolic event, likely cardiac source, and underwent extensive evaluation. There was no evidence of aortic atherosclerosis or large vessel carotid disease. Hypercoagulable workup was negative. There was no atrial fibrillation seen on telemetry monitoring. A TEE showed a small PFO with color flow demonstrated across the atrial septum and a positive bubble study.  The patient is here with her mom today. She is recovering well from her stroke. Denies chest pain, shortness of breath, or heart palpitations. She underwent outpatient cardiac monitoring and declined an implantable loop recorder. She notes that she was taking oral contraceptives until just a few days prior to her stroke.   The patient has otherwise been very healthy. She did have a pituitary tumor resected at [facility] many years ago. She also had an SVT ablation in the past with no recurrence in several years. Otherwise she has not had any significant medical problems.   Past Medical History:  Diagnosis Date  . Pituitary tumor   . SVT (supraventricular tachycardia) (HCC)     Past Surgical History:  Procedure Laterality Date  . BUBBLE STUDY  01/23/2020   Procedure: BUBBLE STUDY;  Surgeon: Josue Hector, MD;  Location: [facility]  ENDOSCOPY;  Service: Cardiovascular;;  . catheter ablation    . IR CT HEAD LTD  01/21/2020  . IR PERCUTANEOUS ART THROMBECTOMY/INFUSION INTRACRANIAL INC DIAG ANGIO  01/21/2020  . KNEE SURGERY    . RADIOLOGY WITH ANESTHESIA N/A 01/21/2020   Procedure: RADIOLOGY WITH ANESTHESIA;  Surgeon: Radiologist, Medication, MD;  Location: [facility];  Service: Radiology;  Laterality: N/A;  . TEE WITHOUT CARDIOVERSION N/A 01/23/2020   Procedure: TRANSESOPHAGEAL ECHOCARDIOGRAM (TEE);  Surgeon: Josue Hector, MD;  Location: [facility] ENDOSCOPY;  Service: Cardiovascular;  Laterality: N/A;  . TONSILLECTOMY      Current Medications: Current Meds  Medication Sig  . aspirin EC 81 MG EC tablet Take 1 tablet (81 mg total) by mouth daily.  Marland Kitchen atorvastatin (LIPITOR) 40 MG tablet Take 1 tablet (40 mg total) by mouth daily.     Allergies:   Oxycodone, Vancomycin, Squid oil, Sulfa antibiotics, and Tape   Social History   Socioeconomic History  . Marital status: Married    Spouse name: Not on file  . Number of children: Not on file  . Years of education: Not on file  . Highest education level: Not on file  Occupational History  . Not on file  Tobacco Use  . Smoking status: Never Smoker  . Smokeless tobacco: Never Used  Substance and Sexual Activity  . Alcohol use: No  . Drug use: No  . Sexual activity: Never  Other Topics Concern  . Not on file  Social History Narrative  . Not on file   Social  Determinants of Health   Financial Resource Strain:   . Difficulty of Paying Living Expenses:   Food Insecurity:   . Worried About Charity fundraiser in the Last Year:   . Arboriculturist in the Last Year:   Transportation Needs:   . Film/video editor (Medical):   Marland Kitchen Lack of Transportation (Non-Medical):   Physical Activity:   . Days of Exercise per Week:   . Minutes of Exercise per Session:   Stress:   . Feeling of Stress :   Social Connections:   . Frequency of Communication with Friends and Family:   .  Frequency of Social Gatherings with Friends and Family:   . Attends Religious Services:   . Active Member of Clubs or Organizations:   . Attends Archivist Meetings:   Marland Kitchen Marital Status:      Family History: The patient's family history includes Diabetes in her father; Hypertension in her mother.  ROS:   Please see the history of present illness.    All other systems reviewed and are negative.  EKGs/Labs/Other Studies Reviewed:    The following studies were reviewed today: TEE: IMPRESSIONS    1. Left ventricular ejection fraction, by estimation, is 60 to 65%. The  left ventricle has normal function. The left ventricle has no regional  wall motion abnormalities.  2. Right ventricular systolic function is normal. The right ventricular  size is normal.  3. No left atrial/left atrial appendage thrombus was detected.  4. The mitral valve is normal in structure. Trivial mitral valve  regurgitation. No evidence of mitral stenosis.  5. The aortic valve is normal in structure. Aortic valve regurgitation is  not visualized. No aortic stenosis is present.  6. The inferior vena cava is normal in size with greater than 50%  respiratory variability, suggesting right atrial pressure of 3 mmHg.  7. Evidence of atrial level shunting detected by color flow Doppler.  Small PFO noted by 2D / color and positive bubble study with right to left  shunting.   Conclusion(s)/Recommendation(s): Normal biventricular function without  evidence of hemodynamically significant valvular heart disease.   Echo 01/21/2020: IMPRESSIONS    1. Left ventricular ejection fraction, by estimation, is 60 to 65%. The  left ventricle has normal function. The left ventricle has no regional  wall motion abnormalities. Left ventricular diastolic parameters were  normal.  2. Right ventricular systolic function is normal. The right ventricular  size is normal.  3. The mitral valve is normal in  structure. Trivial mitral valve  regurgitation. No evidence of mitral stenosis.  4. The aortic valve is normal in structure. Aortic valve regurgitation is  not visualized. No aortic stenosis is present.  5. The inferior vena cava is normal in size with greater than 50%  respiratory variability, suggesting right atrial pressure of 3 mmHg.   TCD Bubble 01/22/2020: Summary:    A vascular evaluation was performed. The right middle cerebral artery was  studied. An IV was inserted into the patient's left hand. Verbal informed  consent was obtained.    Trivial HITS at rest. Mild HITS during Valsalva.    Positive TCd Bubble studyindicative of a small PFO.   EKG:  EKG is not ordered today.    Recent Labs: 01/21/2020: ALT 15; BUN 11; Creatinine, Ser 0.70; Hemoglobin 12.2; Platelets 273; Potassium 3.7; Sodium 140  Recent Lipid Panel    Component Value Date/Time   CHOL 245 (H) 01/22/2020 0438   TRIG 97  01/22/2020 0438   HDL 62 01/22/2020 0438   CHOLHDL 4.0 01/22/2020 0438   VLDL 19 01/22/2020 0438   LDLCALC 164 (H) 01/22/2020 0438    Physical Exam:    VS:  BP 118/74   Pulse 89   Ht 5\' 9"  (1.753 m)   Wt 168 lb 9.6 oz (76.5 kg)   SpO2 98%   BMI 24.90 kg/m     Wt Readings from Last 3 Encounters:  02/21/20 168 lb 9.6 oz (76.5 kg)  02/14/20 168 lb (76.2 kg)  01/21/20 184 lb 15.5 oz (83.9 kg)     GEN:  Well nourished, well developed in no acute distress HEENT: Normal NECK: No JVD; No carotid bruits LYMPHATICS: No lymphadenopathy CARDIAC: RRR, no murmurs, rubs, gallops RESPIRATORY:  Clear to auscultation without rales, wheezing or rhonchi  ABDOMEN: Soft, non-tender, non-distended MUSCULOSKELETAL:  No edema; No deformity  SKIN: Warm and dry NEUROLOGIC:  Alert and oriented x 3 PSYCHIATRIC:  Normal affect   ASSESSMENT:    1. PFO (patent foramen ovale)    PLAN:    In order of problems listed above:  1. 34 yo woman with PFO and cryptogenic stroke.  She is found to  have a PFO by TEE evaluation. I have reviewed the images and the study definitively confirms a PFO that is anatomically suitable for transcatheter closure. There is spontaneous left-to-right color flow through the defect. There is no associated atrial septal aneurysm and the defect appears to be small to moderate in size.   For this patients PFO, there is a high likelihood of contribution to stroke/TIA with a RoPE score of 7 to 10. The patient's Rope score is 9.   Above score calculated as 1 point each if NOT present [HTN, DM2, h/o CVA/TIA, Smoker, Cortical imaging on infarct] Above score calculated as 5 points if [Age 35 to 29], 4 points if [Age 83 to 39], 3 points if [Age 33 to 49], 2 points if [Age 30 to 59], 1 point if [Age 72 to 69], 0 points if [Age > 70]  RoPE score is total sum of individual points (higher total is LESS risk of stroke)  I reviewed the risks, indications, and alternatives to transcatheter closure with the patient. Specific risks include bleeding, infection, device embolization, stroke, cardiac perforation, tamponade, arrhythmia, MI, and late device erosion. He understands these serious risks occur at low incidence of < 1%. She understands post-procedural restrictions for 4-5 days after the procedure, DAPT with ASA and plavix x 3 months, and SBE prophylaxis x 6 months.   They would like to further discuss as they are considering medical therapy versus PFO closure. They will contact us if they decide to proceed.   Medication Adjustments/Labs and Tests Ordered: Current medicines are reviewed at length with the patient today.  Concerns regarding medicines are outlined above.  No orders of the defined types were placed in this encounter.  No orders of the defined types were placed in this encounter.   Patient Instructions  It was nice to meet you!  Please call if you wish to proceed with closure. Burt Ek [phone]     SignedSherren Mocha, MD  02/22/2020 3:27 PM     [place]

## 2020-02-22 ENCOUNTER — Encounter: Payer: Self-pay | Admitting: Cardiovascular Disease

## 2020-02-25 ENCOUNTER — Other Ambulatory Visit: Payer: Self-pay

## 2020-02-25 ENCOUNTER — Ambulatory Visit: Payer: BC Managed Care – PPO

## 2020-02-25 DIAGNOSIS — M6281 Muscle weakness (generalized): Secondary | ICD-10-CM | POA: Diagnosis not present

## 2020-02-25 DIAGNOSIS — R471 Dysarthria and anarthria: Secondary | ICD-10-CM

## 2020-02-25 DIAGNOSIS — R2689 Other abnormalities of gait and mobility: Secondary | ICD-10-CM | POA: Diagnosis not present

## 2020-02-25 DIAGNOSIS — R41841 Cognitive communication deficit: Secondary | ICD-10-CM | POA: Diagnosis not present

## 2020-02-25 DIAGNOSIS — R278 Other lack of coordination: Secondary | ICD-10-CM | POA: Diagnosis not present

## 2020-02-25 NOTE — Therapy (Signed)
[facility] [address] [place], [place], [address] Phone: [phone]   Fax:  [phone]  Speech Language Pathology Treatment  Patient Details  Name: Glenda Hines MRN: [id number] Date of Birth: 1986/01/08 Referring Provider (SLP): Royal Hawthorn., MD   Encounter Date: 02/25/2020  End of Session - 02/25/20 1811    Visit Number  6    Number of Visits  17    Date for SLP Re-Evaluation  05/01/20    SLP Start Time  1320    SLP Stop Time   1352    SLP Time Calculation (min)  32 min    Activity Tolerance  Patient tolerated treatment well       Past Medical History:  Diagnosis Date  . Pituitary tumor   . SVT (supraventricular tachycardia) (HCC)     Past Surgical History:  Procedure Laterality Date  . BUBBLE STUDY  01/23/2020   Procedure: BUBBLE STUDY;  Surgeon: Josue Hector, MD;  Location: [facility] ENDOSCOPY;  Service: Cardiovascular;;  . catheter ablation    . IR CT HEAD LTD  01/21/2020  . IR PERCUTANEOUS ART THROMBECTOMY/INFUSION INTRACRANIAL INC DIAG ANGIO  01/21/2020  . KNEE SURGERY    . RADIOLOGY WITH ANESTHESIA N/A 01/21/2020   Procedure: RADIOLOGY WITH ANESTHESIA;  Surgeon: Radiologist, Medication, MD;  Location: [facility];  Service: Radiology;  Laterality: N/A;  . TEE WITHOUT CARDIOVERSION N/A 01/23/2020   Procedure: TRANSESOPHAGEAL ECHOCARDIOGRAM (TEE);  Surgeon: Josue Hector, MD;  Location: [facility] ENDOSCOPY;  Service: Cardiovascular;  Laterality: N/A;  . TONSILLECTOMY      There were no vitals filed for this visit.         ADULT SLP TREATMENT - 02/25/20 1808      General Information   Behavior/Cognition  Alert;Cooperative;Pleasant mood      Treatment Provided   Treatment provided  Cognitive-Linquistic      Cognitive-Linquistic Treatment   Treatment focused on  Dysarthria    Skilled Treatment  Pt again brought children's books to practice pitch variation/change. Pt difficulty going lower in pitch than  higher in pitch not as apparent today as previous visit but still notably more difficult than higher pitch. Pt performed HEP with independence - pt agrees she is progressed to require x1/week ST at this time.       Assessment / Recommendations / Plan   Plan  Other (Comment)   decr frequency to once/week     Progression Toward Goals   Progression toward goals  Progressing toward goals         SLP Short Term Goals - 02/25/20 1813      SLP SHORT TERM GOAL #1   Title  pt will complete HEP (pitch, articulation) with rare min A over 2 sessions    Baseline  02-20-20    Status  Achieved      SLP SHORT TERM GOAL #2   Title  pt will demo higher pitch range than ~330Hz  in therapy tasks    Time  1    Period  Weeks    Status  On-going       SLP Long Term Goals - 02/25/20 1813      SLP LONG TERM GOAL #1   Title  pt to perform HEP for dysarthria with independence in 3 sessions    Baseline  02-25-20    Time  5    Period  Weeks   or 17 sessions, for all LTGs   Status  On-going  SLP LONG TERM GOAL #2   Title  pt will report incr in pitch range/intonation compared to first session of ST (digital recording will be taken in first 1-2 ST sessions)    Time  5    Period  Weeks    Status  Revised      SLP LONG TERM GOAL #3   Title  pt will have further testing PRN re: difficulty expressing herself verbally    Status  Deferred      SLP LONG TERM GOAL #4   Title  pt will produce 100% intelligible speech in 25 minutees mod complex conversation x 3 sessions    Time  5    Period  Weeks    Status  On-going      SLP LONG TERM GOAL #5   Title  pt QOL will be higher than her original report in the first days of ST    Baseline  84 on 02-14-20    Time  6    Period  Weeks    Status  On-going       Plan - 02/25/20 1811    Clinical Impression Statement  Pt presents today with 100% intelligibility (minor oral motor weakness), and limited vocal pitch range due to rt MCA CVA (with  thrombectomy). See "skilled intervention" for details. Pt would cont to benefit from a program of sklled ST focusing on improving oral muscle stregnth, and increasing speech clarity in conversation at once/week.    Speech Therapy Frequency  1x /week    Duration  --   8 weeks or 17 total sessions   Treatment/Interventions  Oral motor exercises;Functional tasks;Compensatory techniques;SLP instruction and feedback;Patient/family education;Internal/external aids;Cueing hierarchy   possible voice exercises   Potential to Achieve Goals  Good    Consulted and Agree with Plan of Care  Patient       Patient will benefit from skilled therapeutic intervention in order to improve the following deficits and impairments:   Dysarthria and anarthria    Problem List Patient Active Problem List   Diagnosis Date Noted  . PFO (patent foramen ovale)   . Left-sided weakness   . Cryptogenic stroke ([place]) 01/21/2020    [facility] ,[place], [place]  02/25/2020, 6:14 PM  [facility] [address] [place], [place], [address] Phone: [phone]   Fax:  [phone]   Name: Glenda Hines MRN: [id number] Date of Birth: 1985/12/01

## 2020-02-27 ENCOUNTER — Other Ambulatory Visit: Payer: Self-pay

## 2020-02-27 ENCOUNTER — Ambulatory Visit: Payer: BC Managed Care – PPO | Admitting: Occupational Therapy

## 2020-02-27 ENCOUNTER — Encounter: Payer: Self-pay | Admitting: Occupational Therapy

## 2020-02-27 DIAGNOSIS — R471 Dysarthria and anarthria: Secondary | ICD-10-CM | POA: Diagnosis not present

## 2020-02-27 DIAGNOSIS — R41841 Cognitive communication deficit: Secondary | ICD-10-CM | POA: Diagnosis not present

## 2020-02-27 DIAGNOSIS — M6281 Muscle weakness (generalized): Secondary | ICD-10-CM | POA: Diagnosis not present

## 2020-02-27 DIAGNOSIS — R2689 Other abnormalities of gait and mobility: Secondary | ICD-10-CM | POA: Diagnosis not present

## 2020-02-27 DIAGNOSIS — R278 Other lack of coordination: Secondary | ICD-10-CM

## 2020-02-27 NOTE — Therapy (Signed)
[facility] [address] [place], [place], [address] Phone: [phone]   Fax:  [phone]  Occupational Therapy Treatment  Patient Details  Name: Glenda Hines MRN: [id number] Date of Birth: [birth year] Referring Provider (OT): Dr. Leonie Man   Encounter Date: 02/27/2020  OT End of Session - 02/27/20 1843    Visit Number  2    Number of Visits  4    Date for OT Re-Evaluation  03/15/20    Authorization Type  BC/BS    OT Start Time  1750    OT Stop Time  1830    OT Time Calculation (min)  40 min    Activity Tolerance  Patient tolerated treatment well    Behavior During Therapy  [facility] for tasks assessed/performed       Past Medical History:  Diagnosis Date  . Pituitary tumor   . SVT (supraventricular tachycardia) (HCC)     Past Surgical History:  Procedure Laterality Date  . BUBBLE STUDY  01/23/2020   Procedure: BUBBLE STUDY;  Surgeon: Josue Hector, MD;  Location: [facility] ENDOSCOPY;  Service: Cardiovascular;;  . catheter ablation    . IR CT HEAD LTD  01/21/2020  . IR PERCUTANEOUS ART THROMBECTOMY/INFUSION INTRACRANIAL INC DIAG ANGIO  01/21/2020  . KNEE SURGERY    . RADIOLOGY WITH ANESTHESIA N/A 01/21/2020   Procedure: RADIOLOGY WITH ANESTHESIA;  Surgeon: Radiologist, Medication, MD;  Location: [facility];  Service: Radiology;  Laterality: N/A;  . TEE WITHOUT CARDIOVERSION N/A 01/23/2020   Procedure: TRANSESOPHAGEAL ECHOCARDIOGRAM (TEE);  Surgeon: Josue Hector, MD;  Location: [facility] ENDOSCOPY;  Service: Cardiovascular;  Laterality: N/A;  . TONSILLECTOMY      There were no vitals filed for this visit.  Subjective Assessment - 02/27/20 1754    Subjective   Exercises going well    Currently in Pain?  No/denies    Pain Score  0-No pain                   OT Treatments/Exercises (OP) - 02/27/20 0001      Neurological Re-education Exercises   Hand Gripper with Large Beads  3rd level resistance    Other Grasp and  Release Exercises   Digiflex x 5 lbs individually and mass grasp      Fine Motor Coordination (Hand/Wrist)   Fine Motor Coordination  In hand manipuation training;Small Pegboard;Manipulation of small objects;Purdue Pegboard;Grooved pegs    In Hand Manipulation Training  Moving objects from fingertips to palm, and from ulnar side to radial side of hand    Grooved pegs  Tremor present in BUE    Purdue Pegboard  Difficult task, especially orienting peg to hole- grading pressure                  OT Long Term Goals - 02/27/20 1800      OT LONG TERM GOAL #2   Title  Improve coordination Lt hand as evidenced by performing 9 hole peg test in 22 sec or less    Baseline  22.97    Status  On-going            Plan - 02/27/20 1844    Clinical Impression Statement  Patient is progressing rapidly - already improved coordination in LUE - needs additional strengthening    OT Frequency  1x / week    OT Duration  4 weeks    OT Treatment/Interventions  Therapeutic activities;Manual Therapy;DME and/or AE instruction;Patient/family education;Therapeutic exercise  Plan  resistance training for LUE    OT Home Exercise Plan  Putty , coordination    Consulted and Agree with Plan of Care  Patient       Patient will benefit from skilled therapeutic intervention in order to improve the following deficits and impairments:           Visit Diagnosis: Muscle weakness (generalized)  Other lack of coordination    Problem List Patient Active Problem List   Diagnosis Date Noted  . PFO (patent foramen ovale)   . Left-sided weakness   . Cryptogenic stroke ([place]) 01/21/2020    Mariah Milling, OTR/L 02/27/2020, 6:45 PM  [facility] [address] [place], [place], [address] Phone: [phone]   Fax:  [phone]  Name: Glenda Hines MRN: [id number] Date of Birth: [birth year]

## 2020-03-02 ENCOUNTER — Other Ambulatory Visit: Payer: Self-pay

## 2020-03-02 ENCOUNTER — Encounter: Payer: Self-pay | Admitting: Occupational Therapy

## 2020-03-02 ENCOUNTER — Ambulatory Visit: Payer: BC Managed Care – PPO | Admitting: Occupational Therapy

## 2020-03-02 DIAGNOSIS — R278 Other lack of coordination: Secondary | ICD-10-CM | POA: Diagnosis not present

## 2020-03-02 DIAGNOSIS — M6281 Muscle weakness (generalized): Secondary | ICD-10-CM

## 2020-03-02 DIAGNOSIS — R41841 Cognitive communication deficit: Secondary | ICD-10-CM | POA: Diagnosis not present

## 2020-03-02 DIAGNOSIS — R2689 Other abnormalities of gait and mobility: Secondary | ICD-10-CM | POA: Diagnosis not present

## 2020-03-02 DIAGNOSIS — R471 Dysarthria and anarthria: Secondary | ICD-10-CM | POA: Diagnosis not present

## 2020-03-02 NOTE — Patient Instructions (Signed)
Access Code: [email or web address] URL: [email or web address] Date: 03/02/2020 Prepared by: Marlowe Sax  Exercises Seated Single Arm Shoulder Flexion with Dumbbell - 1 x daily - 7 x weekly - 10 reps - 3 sets Standing Full Range Shoulder Flexion with Dumbbells - 1 x daily - 7 x weekly - 10 reps - 3 sets Seated Single Arm Elbow Flexion with Dumbbell - 1 x daily - 7 x weekly - 10 reps - 3 sets Seated Single Arm Overhead Elbow Extension with Dumbbell - 1 x daily - 7 x weekly - 10 reps - 3 sets Seated Single Arm Forearm Supination Pronation with Dumbbell - 1 x daily - 7 x weekly - 10 reps - 3 sets Seated Wrist Extension with Dumbbell - 1 x daily - 7 x weekly - 10 reps - 3 sets Seated Wrist Flexion with Dumbbell - 1 x daily - 7 x weekly - 10 reps - 3 sets Seated Wrist Circumduction with Dumbbell - 1 x daily - 7 x weekly - 10 reps - 3 sets Seated Wrist Radial Deviation with Dumbbell - 1 x daily - 7 x weekly - 10 reps - 3 sets

## 2020-03-02 NOTE — Therapy (Signed)
[facility] [address] [place], [place], [address] Phone: [phone]   Fax:  [phone]  Occupational Therapy Treatment  Patient Details  Name: Glenda Hines MRN: [id number] Date of Birth: 1986-05-20 Referring Provider (OT): Dr. Leonie Man   Encounter Date: 03/02/2020  OT End of Session - 03/02/20 1544    Visit Number  3    Number of Visits  4    Date for OT Re-Evaluation  03/15/20    Authorization Type  BC/BS    OT Start Time  1450    OT Stop Time  1533    OT Time Calculation (min)  43 min    Activity Tolerance  Patient tolerated treatment well    Behavior During Therapy  [facility] for tasks assessed/performed       Past Medical History:  Diagnosis Date  . Pituitary tumor   . SVT (supraventricular tachycardia) (HCC)     Past Surgical History:  Procedure Laterality Date  . BUBBLE STUDY  01/23/2020   Procedure: BUBBLE STUDY;  Surgeon: Josue Hector, MD;  Location: [facility] ENDOSCOPY;  Service: Cardiovascular;;  . catheter ablation    . IR CT HEAD LTD  01/21/2020  . IR PERCUTANEOUS ART THROMBECTOMY/INFUSION INTRACRANIAL INC DIAG ANGIO  01/21/2020  . KNEE SURGERY    . RADIOLOGY WITH ANESTHESIA N/A 01/21/2020   Procedure: RADIOLOGY WITH ANESTHESIA;  Surgeon: Radiologist, Medication, MD;  Location: [facility];  Service: Radiology;  Laterality: N/A;  . TEE WITHOUT CARDIOVERSION N/A 01/23/2020   Procedure: TRANSESOPHAGEAL ECHOCARDIOGRAM (TEE);  Surgeon: Josue Hector, MD;  Location: [facility] ENDOSCOPY;  Service: Cardiovascular;  Laterality: N/A;  . TONSILLECTOMY      There were no vitals filed for this visit.  Subjective Assessment - 03/02/20 1454    Subjective   Unpacked boxes all weekend    Currently in Pain?  No/denies    Pain Score  0-No pain                   OT Treatments/Exercises (OP) - 03/02/20 0001      Shoulder Exercises: Seated   Flexion  Strengthening;AROM;Left;12 reps;Weights    Flexion Weight (lbs)   5lb    Other Seated Exercises  See patient instructions      Shoulder Exercises: ROM/Strengthening   Plank  Other (comment)   Patient not fully cleared for weight training     Elbow Exercises   Bar Weights/Barbell (Elbow Flexion)  5 lbs    Forearm Supination  AROM;Strengthening;Left;10 reps;Seated    Forearm Pronation  AROM;Strengthening;Left      Wrist Exercises   Wrist Flexion  AROM;Strengthening;Left;15 reps;Bar weights/barbell    Bar Weights/Barbell (Wrist Flexion)  2 lbs    Wrist Extension  AROM;Strengthening;Left;15 reps;Seated;Bar weights/barbell    Bar Weights/Barbell (Wrist Extension)  2 lbs    Wrist Radial Deviation  AROM;Self ROM;Left;15 reps;Seated;Bar weights/barbell    Bar Weights/Barbell (Radial Deviation)  2 lbs      Fine Motor Coordination (Hand/Wrist)   Fine Motor Coordination  Manipulation of small objects   jenga, barrel of monkeys, LUE            OT Education - 03/02/20 1543    Education Details  Strengthening HEP shoulder, elbow, wrist    Person(s) Educated  Patient    Methods  Explanation;Demonstration;Tactile cues;Verbal cues;Handout    Comprehension  Verbalized understanding;Returned demonstration          OT Long Term Goals - 03/02/20 1521  OT LONG TERM GOAL #1   Title  Independent with HEP's for coordination, shoulder and grip strength    Status  On-going      OT LONG TERM GOAL #2   Title  Improve coordination Lt hand as evidenced by performing 9 hole peg test in 22 sec or less   17.25   Status  Achieved            Plan - 03/02/20 1544    Clinical Impression Statement  Patient is progressing toward goals, anticipate discharge next session.    OT Frequency  1x / week    OT Duration  4 weeks    OT Treatment/Interventions  Therapeutic activities;Manual Therapy;DME and/or AE instruction;Patient/family education;Therapeutic exercise    Plan  check grip strength first, review HEP add any HAP    OT Home Exercise Plan  Putty ,  coordination, Dumb bell exercises added 5/24 for shoulder, elbow, forearm, wrist    Consulted and Agree with Plan of Care  Patient       Patient will benefit from skilled therapeutic intervention in order to improve the following deficits and impairments:           Visit Diagnosis: Muscle weakness (generalized)  Other lack of coordination    Problem List Patient Active Problem List   Diagnosis Date Noted  . PFO (patent foramen ovale)   . Left-sided weakness   . Cryptogenic stroke ([place]) 01/21/2020    Mariah Milling, OTR/L 03/02/2020, 3:46 PM  [facility] [address] [place], [place], [address] Phone: [phone]   Fax:  [phone]  Name: Glenda Hines MRN: [id number] Date of Birth: 1985-12-11

## 2020-03-03 ENCOUNTER — Ambulatory Visit: Payer: BC Managed Care – PPO

## 2020-03-03 DIAGNOSIS — M6281 Muscle weakness (generalized): Secondary | ICD-10-CM | POA: Diagnosis not present

## 2020-03-03 DIAGNOSIS — R471 Dysarthria and anarthria: Secondary | ICD-10-CM

## 2020-03-03 DIAGNOSIS — R41841 Cognitive communication deficit: Secondary | ICD-10-CM | POA: Diagnosis not present

## 2020-03-03 DIAGNOSIS — R2689 Other abnormalities of gait and mobility: Secondary | ICD-10-CM | POA: Diagnosis not present

## 2020-03-03 DIAGNOSIS — R278 Other lack of coordination: Secondary | ICD-10-CM | POA: Diagnosis not present

## 2020-03-03 NOTE — Therapy (Signed)
[facility] [address] [place], [place], [address] Phone: [phone]   Fax:  [phone]  Speech Language Pathology Treatment  Patient Details  Name: Jesenya Frederico MRN: [id number] Date of Birth: 1986/08/26 Referring Provider (SLP): Royal Hawthorn., MD   Encounter Date: 03/03/2020  End of Session - 03/03/20 1716    Visit Number  7    Number of Visits  17    Date for SLP Re-Evaluation  05/01/20    SLP Start Time  1450    SLP Stop Time   1528    SLP Time Calculation (min)  38 min    Activity Tolerance  Patient tolerated treatment well       Past Medical History:  Diagnosis Date  . Pituitary tumor   . SVT (supraventricular tachycardia) (HCC)     Past Surgical History:  Procedure Laterality Date  . BUBBLE STUDY  01/23/2020   Procedure: BUBBLE STUDY;  Surgeon: Josue Hector, MD;  Location: [facility] ENDOSCOPY;  Service: Cardiovascular;;  . catheter ablation    . IR CT HEAD LTD  01/21/2020  . IR PERCUTANEOUS ART THROMBECTOMY/INFUSION INTRACRANIAL INC DIAG ANGIO  01/21/2020  . KNEE SURGERY    . RADIOLOGY WITH ANESTHESIA N/A 01/21/2020   Procedure: RADIOLOGY WITH ANESTHESIA;  Surgeon: Radiologist, Medication, MD;  Location: [facility];  Service: Radiology;  Laterality: N/A;  . TEE WITHOUT CARDIOVERSION N/A 01/23/2020   Procedure: TRANSESOPHAGEAL ECHOCARDIOGRAM (TEE);  Surgeon: Josue Hector, MD;  Location: [facility] ENDOSCOPY;  Service: Cardiovascular;  Laterality: N/A;  . TONSILLECTOMY      There were no vitals filed for this visit.  Subjective Assessment - 03/03/20 1458    Subjective  8 minutes late    Currently in Pain?  No/denies            ADULT SLP TREATMENT - 03/03/20 1500      General Information   Behavior/Cognition  Alert;Cooperative;Pleasant mood      Treatment Provided   Treatment provided  Cognitive-Linquistic      Cognitive-Linquistic Treatment   Treatment focused on  Dysarthria    Skilled Treatment  Pt  brought  books to practice pitch variation/change. Pt with cont'd difficulty going lower in pitch than higher in pitch - approx same as previous session with SLP. Pt performed HEP with independence - pt agrees she is progressed to require once every other week ST. SLP reitereated to pt to perform all her tasks daily. SLP and pt agreed they will meet again 03-17-20 and then again on 04-07-20.       Assessment / Recommendations / Plan   Plan  Other (Comment)   decr to once every other week     Progression Toward Goals   Progression toward goals  Progressing toward goals         SLP Short Term Goals - 03/03/20 1717      SLP SHORT TERM GOAL #1   Title  pt will complete HEP (pitch, articulation) with rare min A over 2 sessions    Baseline  02-20-20    Status  Achieved      SLP SHORT TERM GOAL #2   Title  pt will demo higher pitch range than ~330Hz  in therapy tasks    Status  Deferred   added to [place] - 03/03/20 1717      SLP LONG TERM GOAL #1   Title  pt to perform HEP for dysarthria  with independence in 3 sessions    Baseline  02-25-20, 03-03-20    Time  4    Period  Weeks   or 17 sessions, for all LTGs   Status  On-going      SLP LONG TERM GOAL #2   Title  pt will report incr in pitch range/intonation compared to first session of ST (digital recording will be taken in first 1-2 ST sessions)    Time  4    Period  Weeks    Status  On-going      SLP LONG TERM GOAL #3   Title  pt will have further testing PRN re: difficulty expressing herself verbally    Status  Deferred      SLP LONG TERM GOAL #4   Title  pt will produce 100% intelligible speech in 25 minutees mod complex conversation x 3 sessions    Baseline  03-03-20    Time  4    Period  Weeks    Status  On-going      SLP LONG TERM GOAL #5   Title  pt QOL will be higher than her original report in the first days of ST    Baseline  84 on 02-14-20    Time  5    Period  Weeks    Status  On-going       Additional Long Term Goals   Additional Long Term Goals  Yes       Plan - 03/03/20 1716    Clinical Impression Statement  Pt presents today with 100% intelligibility (minor oral motor weakness), and 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 See "skilled intervention" for details. Pt would cont to benefit from a program of sklled ST focusing on improving oral muscle stregnth, and increasing speech clarity in conversation at once/week.    Speech Therapy Frequency  --   at least once every other week   Duration  --   8 weeks or 17 total sessions   Treatment/Interventions  Oral motor exercises;Functional tasks;Compensatory techniques;SLP instruction and feedback;Patient/family education;Internal/external aids;Cueing hierarchy   possible voice exercises   Potential to Achieve Goals  Good    Consulted and Agree with Plan of Care  Patient       Patient will benefit from skilled therapeutic intervention in order to improve the following deficits and impairments:   Dysarthria and anarthria    Problem List Patient Active Problem List   Diagnosis Date Noted  . PFO (patent foramen ovale)   . Left-sided weakness   . Cryptogenic stroke ([place]) 01/21/2020    [facility] ,[place], [place]  03/03/2020, 5:19 PM  [facility] [address] [place], [place], [address] Phone: [phone]   Fax:  [phone]   Name: Anelys Duro MRN: [id number] Date of Birth: June 20, 1986

## 2020-03-10 ENCOUNTER — Other Ambulatory Visit: Payer: Self-pay

## 2020-03-10 ENCOUNTER — Ambulatory Visit: Payer: BC Managed Care – PPO | Attending: Neurology | Admitting: Occupational Therapy

## 2020-03-10 ENCOUNTER — Encounter: Payer: Self-pay | Admitting: Occupational Therapy

## 2020-03-10 DIAGNOSIS — R278 Other lack of coordination: Secondary | ICD-10-CM | POA: Insufficient documentation

## 2020-03-10 DIAGNOSIS — R471 Dysarthria and anarthria: Secondary | ICD-10-CM | POA: Insufficient documentation

## 2020-03-10 DIAGNOSIS — D225 Melanocytic nevi of trunk: Secondary | ICD-10-CM | POA: Diagnosis not present

## 2020-03-10 DIAGNOSIS — M6281 Muscle weakness (generalized): Secondary | ICD-10-CM

## 2020-03-10 DIAGNOSIS — D1801 Hemangioma of skin and subcutaneous tissue: Secondary | ICD-10-CM | POA: Diagnosis not present

## 2020-03-10 NOTE — Therapy (Signed)
[facility] [address] [place], [place], [address] Phone: [phone]   Fax:  [phone]  Occupational Therapy Treatment  Patient Details  Name: Glenda Hines MRN: [id number] Date of Birth: Sep 15, 1986 Referring Provider (OT): Dr. Leonie Man   Encounter Date: 03/10/2020  OT End of Session - 03/10/20 1429    Visit Number  4    Number of Visits  4    Date for OT Re-Evaluation  03/15/20    Authorization Type  BC/BS    OT Start Time  1402    OT Stop Time  1420    OT Time Calculation (min)  18 min    Activity Tolerance  Patient tolerated treatment well    Behavior During Therapy  [facility] for tasks assessed/performed       Past Medical History:  Diagnosis Date  . Pituitary tumor   . SVT (supraventricular tachycardia) (HCC)     Past Surgical History:  Procedure Laterality Date  . BUBBLE STUDY  01/23/2020   Procedure: BUBBLE STUDY;  Surgeon: Josue Hector, MD;  Location: [facility] ENDOSCOPY;  Service: Cardiovascular;;  . catheter ablation    . IR CT HEAD LTD  01/21/2020  . IR PERCUTANEOUS ART THROMBECTOMY/INFUSION INTRACRANIAL INC DIAG ANGIO  01/21/2020  . KNEE SURGERY    . RADIOLOGY WITH ANESTHESIA N/A 01/21/2020   Procedure: RADIOLOGY WITH ANESTHESIA;  Surgeon: Radiologist, Medication, MD;  Location: [facility];  Service: Radiology;  Laterality: N/A;  . TEE WITHOUT CARDIOVERSION N/A 01/23/2020   Procedure: TRANSESOPHAGEAL ECHOCARDIOGRAM (TEE);  Surgeon: Josue Hector, MD;  Location: [facility] ENDOSCOPY;  Service: Cardiovascular;  Laterality: N/A;  . TONSILLECTOMY      There were no vitals filed for this visit.                OT Treatments/Exercises (OP) - 03/10/20 0001      ADLs   Driving  Discussed return to driving in graduated driving program.  Sent message to neuro regarding no concerns from OT persepctive relating to driving.      ADL Comments  Reviewed reamining goals and exercise program.  Patient hoping to have  lifting restrictions d/c'd at tomorrow's neuro appt.               OT Education - 03/10/20 1428    Education Details  Discussed safe return to driving and exercise if cleared by MD    Person(s) Educated  Patient    Methods  Explanation    Comprehension  Verbalized understanding          OT Long Term Goals - 03/10/20 1407      OT LONG TERM GOAL #1   Title  Independent with HEP's for coordination, shoulder and grip strength    Status  Achieved      OT LONG TERM GOAL #2   Title  Improve coordination Lt hand as evidenced by performing 9 hole peg test in 22 sec or less    Baseline  17.25    Status  Achieved      OT LONG TERM GOAL #3   Title  Improve grip strength Lt hand to 65 lbs or greater to assist in opening tight jars and return to leisure activities    Baseline  71.6    Status  Achieved            Plan - 03/10/20 1429    Clinical Impression Statement  Patient has met all OT goals and is agreeable to  discharge.    OT Frequency  1x / week    OT Duration  4 weeks    OT Treatment/Interventions  Therapeutic activities;Manual Therapy;DME and/or AE instruction;Patient/family education;Therapeutic exercise    Plan  discharge    OT Home Exercise Plan  Putty , coordination, Dumb bell exercises added 5/24 for shoulder, elbow, forearm, wrist    Consulted and Agree with Plan of Care  Patient       Patient will benefit from skilled therapeutic intervention in order to improve the following deficits and impairments:           Visit Diagnosis: Muscle weakness (generalized)  Other lack of coordination    Problem List Patient Active Problem List   Diagnosis Date Noted  . PFO (patent foramen ovale)   . Left-sided weakness   . Cryptogenic stroke ([place]) 01/21/2020   OCCUPATIONAL THERAPY DISCHARGE SUMMARY  Visits from Start of Care: 4  Current functional level related to goals / functional outcomes: Patient shows improved strength, endurance, and coordination  in LUE   Remaining deficits: Tremor all 4 limbs   Education / Equipment: Strength and coordination exercises Plan: Patient agrees to discharge.  Patient goals were partially met. Patient is being discharged due to meeting the stated rehab goals.  ?????      Mariah Milling, OTR/L 03/10/2020, 2:31 PM  [facility] [address] [place], [place], [address] Phone: [phone]   Fax:  [phone]  Name: Deysi Soldo MRN: [id number] Date of Birth: 1986-04-02

## 2020-03-11 ENCOUNTER — Telehealth: Payer: Self-pay | Admitting: Neurology

## 2020-03-11 ENCOUNTER — Encounter: Payer: Self-pay | Admitting: Neurology

## 2020-03-11 ENCOUNTER — Ambulatory Visit: Payer: BC Managed Care – PPO | Admitting: Neurology

## 2020-03-11 VITALS — BP 114/71 | HR 97 | Ht 68.0 in | Wt 169.0 lb

## 2020-03-11 DIAGNOSIS — Q211 Atrial septal defect: Secondary | ICD-10-CM

## 2020-03-11 DIAGNOSIS — I639 Cerebral infarction, unspecified: Secondary | ICD-10-CM | POA: Diagnosis not present

## 2020-03-11 DIAGNOSIS — Q2112 Patent foramen ovale: Secondary | ICD-10-CM

## 2020-03-11 NOTE — Telephone Encounter (Signed)
When checking out, she said she needs a refill of the Lipitor.

## 2020-03-11 NOTE — Progress Notes (Signed)
[facility] [address] [place]. [place] [address] [phone]       OFFICE FOLLOW-UP NOTE  [name] Date of Birth:  1986/05/22 Medical Record Number:  [id number]   HPI: Glenda Hines is a pleasant 34 year old Caucasian lady seen today for initial office follow-up visit following hospital admission for stroke in April 2021.  She is accompanied by her mother.  History is obtained from them, review of electronic medical records and I personally reviewed imaging films in PACS.  She has no significant past medical history.  She presented on 01/21/2020 with last seen normal being 2200 hrs. the night prior.  She woke up at 8 in the morning and did not feel right and noticed facial droop right gaze deviation and left arm weakness.  She is unable to stand when she tried to get out of bed.  EMS was called.  She was brought in as a code stroke she was noted as having right gaze preference left-sided neglect and left facial droop and left arm weakness.  She did not receive TPA as she was a wake-up stroke.  NIH stroke scale was 10.  CT angiogram of the head and neck showed no emergent large vessel occlusion but there was a irregular luminal thrombus of the right M1 segment causing moderate stenosis with a right M3 branch occlusion adjacent to the acute infarct by CT perfusion with 130 cc of delayed/ischemic blood flow mainly in the upper division of the right MCA and right ACA territory.  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  After discussing risk-benefit with the patient and family patient was taken for emergent mechanical thrombectomy and underwent complete recanalization of the right pericallosal artery with no change after attempted recanalization of the right M3 anterior division.  Patient had a small right frontal subarachnoid hemorrhage post procedure.  She was kept in the neuro ICU with blood pressure tightly controlled with close neurological monitoring and did  well.  Patient gave a history of prior history of 3 miscarriages but no deep vein thrombosis.  She was on birth control pills for 6 weeks prior to her stroke and it just stopped a few days ago.  She had prior history of palpitation and had been seen at [facility] and had ablation for supraventricular tachycardia.  She still had palpitations after that but it was unclear if she had any history of A. fib.  She was seen by electrophysiology team who recommended loop recorder for long-term monitoring but the patient refused and instead underwent outpatient 30-day heart monitor which was negative for A. fib.  She did very well and at the time of discharge had an NIH stroke scale of 4 with mild left-sided neglect and left grip and hand weakness.  Hypercoagulable lab panel was negative.  ANA and ESR were normal.  TEE showed no definite cardiac source of embolism except for small patent foramen ovale.  Her calculated rope score was 8 with 84% probability of PFO being related to her stroke.  She was seen by Dr. Sherren Mocha interventional cardiologist as outpatient for endovascular PFO closure but the patient prefers to get a second opinion at [facility] where she had previous ablation performed for PSVT and has a schedule in the next few weeks.  She is done extremely well and states she has made full neurological recovery.  When she is tired she feels the left and fine motor skills may be slightly diminished.  She is tolerating aspirin well without  side effects.  She is also on Lipitor which is tolerating well but has not had follow-up lipid profile checked yet.  She has had no other recurrent stroke or TIA symptoms.  ROS:   14 system review of systems is positive for left hand weakness, diminished dexterity and all other systems negative  PMH:  Past Medical History:  Diagnosis Date  . Pituitary tumor   . SVT (supraventricular tachycardia) ([place])     Social History:  Social History   Socioeconomic History    . Marital status: Married    Spouse name: Not on file  . Number of children: Not on file  . Years of education: Not on file  . Highest education level: Not on file  Occupational History  . Not on file  Tobacco Use  . Smoking status: Never Smoker  . Smokeless tobacco: Never Used  Substance and Sexual Activity  . Alcohol use: No  . Drug use: No  . Sexual activity: Never  Other Topics Concern  . Not on file  Social History Narrative  . Not on file   Social Determinants of Health   Financial Resource Strain:   . Difficulty of Paying Living Expenses:   Food Insecurity:   . Worried About Charity fundraiser in the Last Year:   . Arboriculturist in the Last Year:   Transportation Needs:   . Film/video editor (Medical):   Marland Kitchen Lack of Transportation (Non-Medical):   Physical Activity:   . Days of Exercise per Week:   . Minutes of Exercise per Session:   Stress:   . Feeling of Stress :   Social Connections:   . Frequency of Communication with Friends and Family:   . Frequency of Social Gatherings with Friends and Family:   . Attends Religious Services:   . Active Member of Clubs or Organizations:   . Attends Archivist Meetings:   Marland Kitchen Marital Status:   Intimate Partner Violence:   . Fear of Current or Ex-Partner:   . Emotionally Abused:   Marland Kitchen Physically Abused:   . Sexually Abused:     Medications:   Current Outpatient Medications on File Prior to Visit  Medication Sig Dispense Refill  . aspirin EC 81 MG EC tablet Take 1 tablet (81 mg total) by mouth daily.    Marland Kitchen atorvastatin (LIPITOR) 40 MG tablet Take 1 tablet (40 mg total) by mouth daily. 30 tablet 1  . Multiple Vitamin (MULTIVITAMIN ADULT PO) Take by mouth.     No current facility-administered medications on file prior to visit.    Allergies:   Allergies  Allergen Reactions  . Oxycodone Shortness Of Breath  . Vancomycin Rash  . Squid Oil Nausea And Vomiting    Squid   . Sulfa Antibiotics Rash  .  Tape Rash    Physical Exam General: well developed, well nourished slightly overweight young Caucasian lady, seated, in no evident distress Head: head normocephalic and atraumatic.  Neck: supple with no carotid or supraclavicular bruits Cardiovascular: regular rate and rhythm, no murmurs Musculoskeletal: no deformity Skin:  no rash/petichiae Vascular:  Normal pulses all extremities Vitals:   03/11/20 1522  BP: 114/71  Pulse: 97   Neurologic Exam Mental Status: Awake and fully alert. Oriented to place and time. Recent and remote memory intact. Attention span, concentration and fund of knowledge appropriate. Mood and affect appropriate.  Cranial Nerves: Fundoscopic exam reveals sharp disc margins. Pupils equal, briskly reactive to light. Extraocular movements  full without nystagmus. Visual fields full to confrontation. Hearing intact. Facial sensation intact. Face, tongue, palate moves normally and symmetrically.  Motor: Normal bulk and tone. Normal strength in all tested extremity muscles.  Slightly diminished fine finger movements on the left.  Orbits right over left upper extremity. Sensory.: intact to touch ,pinprick .position and vibratory sensation.  Coordination: Rapid alternating movements normal in all extremities. Finger-to-nose and heel-to-shin performed accurately bilaterally. Gait and Station: Arises from chair without difficulty. Stance is normal. Gait demonstrates normal stride length and balance . Able to heel, toe and tandem walk without difficulty.  Reflexes: 1+ and symmetric. Toes downgoing.   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  Vascular risk factors of hyperlipidemia and birth control pills and PFO     PLAN: I had a long d/w patient and her mother  about her recent  Cryptogenic  stroke,PFO, risk for recurrent stroke/TIAs, personally independently reviewed imaging studies and stroke evaluation results and answered questions.Continue aspirin 81 mg daily  for secondary stroke prevention and maintain strict control of hypertension with blood pressure goal below 130/90, diabetes with hemoglobin A1c goal below 6.5% and lipids with LDL cholesterol goal below 70 mg/dL. I also advised the patient to eat a healthy diet with plenty of whole grains, cereals, fruits and vegetables, exercise regularly and maintain ideal body weight.  I recommend endovascular PFO closure given her rope score of 8 she has a 84% chance that the stroke was due to PFO.  She has already met with Dr. Burt Knack but prefers to get a second opinion at [facility] prior to having the PFO closed.  She was asked to avoid taking estrogen containing birth control pills in the future.  She was advised to start physical activity gradually and increase as tolerated.  Check follow-up lipid profile today.  Followup in the future with me in 3 months or call earlier if necessary. Greater than 50% of time during this 55 minute visit was spent on counseling,explanation of diagnosis of cryptogenic stroke, PFO, planning of further management, discussion with patient and family and coordination of care Antony Contras, MD  [facility] [address] [place], [place] [address]  Phone [phone] Fax [phone] Note: This document was prepared with digital dictation and possible smart phrase technology. Any transcriptional errors that result from this process are unintentional

## 2020-03-11 NOTE — Patient Instructions (Addendum)
I had a long d/w patient and her mother  about her recent  Cryptogenic stroke,PFO, risk for recurrent stroke/TIAs, personally independently reviewed imaging studies and stroke evaluation results and answered questions.Continue aspirin 81 mg daily  for secondary stroke prevention and maintain strict control of hypertension with blood pressure goal below 130/90, diabetes with hemoglobin A1c goal below 6.5% and lipids with LDL cholesterol goal below 70 mg/dL. I also advised the patient to eat a healthy diet with plenty of whole grains, cereals, fruits and vegetables, exercise regularly and maintain ideal body weight.  I recommend endovascular PFO closure given her rope score of 8 she has a 84% chance that the stroke was due to PFO.  She has already met with Dr. Burt Knack but prefers to get a second opinion at [facility] prior to having the PFO closed.  She was asked to avoid taking estrogen containing birth control pills in the future.  She was advised to start physical activity gradually and increase as tolerated.  Check follow-up lipid profile today.  Followup in the future with me in 3 months or call earlier if necessary.  Stroke Prevention Some medical conditions and behaviors are associated with a higher chance of having a stroke. You can help prevent a stroke by making nutrition, lifestyle, and other changes, including managing any medical conditions you may have. What nutrition changes can be made?   Eat healthy foods. You can do this by: ? Choosing foods high in fiber, such as fresh fruits and vegetables and whole grains. ? Eating at least 5 or more servings of fruits and vegetables a day. Try to fill half of your plate at each meal with fruits and vegetables. ? Choosing lean protein foods, such as lean cuts of meat, poultry without skin, fish, tofu, beans, and nuts. ? Eating low-fat dairy products. ? Avoiding foods that are high in salt (sodium). This can help lower blood pressure. ? Avoiding foods  that have saturated fat, trans fat, and cholesterol. This can help prevent high cholesterol. ? Avoiding processed and premade foods.  Follow your health care provider's specific guidelines for losing weight, controlling high blood pressure (hypertension), lowering high cholesterol, and managing diabetes. These may include: ? Reducing your daily calorie intake. ? Limiting your daily sodium intake to 1,500 milligrams (mg). ? Using only healthy fats for cooking, such as olive oil, canola oil, or sunflower oil. ? Counting your daily carbohydrate intake. What lifestyle changes can be made?  Maintain a healthy weight. Talk to your health care provider about your ideal weight.  Get at least 30 minutes of moderate physical activity at least 5 days a week. Moderate activity includes brisk walking, biking, and swimming.  Do not use any products that contain nicotine or tobacco, such as cigarettes and e-cigarettes. If you need help quitting, ask your health care provider. It may also be helpful to avoid exposure to secondhand smoke.  Limit alcohol intake to no more than 1 drink a day for nonpregnant women and 2 drinks a day for men. One drink equals 12 oz of beer, 5 oz of wine, or 1 oz of hard liquor.  Stop any illegal drug use.  Avoid taking birth control pills. Talk to your health care provider about the risks of taking birth control pills if: ? You are over 93 years old. ? You smoke. ? You get migraines. ? You have ever had a blood clot. What other changes can be made?  Manage your cholesterol levels. ? Eating a  healthy diet is important for preventing high cholesterol. If cholesterol cannot be managed through diet alone, you may also need to take medicines. ? Take any prescribed medicines to control your cholesterol as told by your health care provider.  Manage your diabetes. ? Eating a healthy diet and exercising regularly are important parts of managing your blood sugar. If your blood  sugar cannot be managed through diet and exercise, you may need to take medicines. ? Take any prescribed medicines to control your diabetes as told by your health care provider.  Control your hypertension. ? To reduce your risk of stroke, try to keep your blood pressure below 130/80. ? Eating a healthy diet and exercising regularly are an important part of controlling your blood pressure. If your blood pressure cannot be managed through diet and exercise, you may need to take medicines. ? Take any prescribed medicines to control hypertension as told by your health care provider. ? Ask your health care provider if you should monitor your blood pressure at home. ? Have your blood pressure checked every year, even if your blood pressure is normal. Blood pressure increases with age and some medical conditions.  Get evaluated for sleep disorders (sleep apnea). Talk to your health care provider about getting a sleep evaluation if you snore a lot or have excessive sleepiness.  Take over-the-counter and prescription medicines only as told by your health care provider. Aspirin or blood thinners (antiplatelets or anticoagulants) may be recommended to reduce your risk of forming blood clots that can lead to stroke.  Make sure that any other medical conditions you have, such as atrial fibrillation or atherosclerosis, are managed. What are the warning signs of a stroke? The warning signs of a stroke can be easily remembered as BEFAST.  B is for balance. Signs include: ? Dizziness. ? Loss of balance or coordination. ? Sudden trouble walking.  E is for eyes. Signs include: ? A sudden change in vision. ? Trouble seeing.  F is for face. Signs include: ? Sudden weakness or numbness of the face. ? The face or eyelid drooping to one side.  A is for arms. Signs include: ? Sudden weakness or numbness of the arm, usually on one side of the body.  S is for speech. Signs include: ? Trouble speaking  (aphasia). ? Trouble understanding.  T is for time. ? These symptoms may represent a serious problem that is an emergency. Do not wait to see if the symptoms will go away. Get medical help right away. Call your local emergency services (911 in the [place] Do not drive yourself to the hospital.  Other signs of stroke may include: ? A sudden, severe headache with no known cause. ? Nausea or vomiting. ? Seizure. Where to find more information For more information, visit:  American Stroke Association: www.strokeassociation.org  National Stroke Association: www.stroke.org Summary  You can prevent a stroke by eating healthy, exercising, not smoking, limiting alcohol intake, and managing any medical conditions you may have.  Do not use any products that contain nicotine or tobacco, such as cigarettes and e-cigarettes. If you need help quitting, ask your health care provider. It may also be helpful to avoid exposure to secondhand smoke.  Remember BEFAST for warning signs of stroke. Get help right away if you or a loved one has any of these signs. This information is not intended to replace advice given to you by your health care provider. Make sure you discuss any questions you have with your health  care provider. Document Revised: 09/08/2017 Document Reviewed: 11/01/2016 Elsevier Patient Education  2020 [employer].

## 2020-03-12 ENCOUNTER — Other Ambulatory Visit: Payer: Self-pay

## 2020-03-12 LAB — LIPID PANEL
Chol/HDL Ratio: 2 ratio (ref 0.0–4.4)
Cholesterol, Total: 121 mg/dL (ref 100–199)
HDL: 60 mg/dL (ref >39)
LDL Chol Calc (NIH): 47 mg/dL (ref 0–99)
Triglycerides: 68 mg/dL (ref 0–149)
VLDL Cholesterol Cal: 14 mg/dL (ref 5–40)

## 2020-03-12 MED ORDER — ATORVASTATIN CALCIUM 40 MG PO TABS: 40.0000 mg | ORAL_TABLET | Freq: Every day | ORAL | 0 refills | Status: DC

## 2020-03-12 NOTE — Progress Notes (Signed)
Kindly inform the patient that lipid profile is satisfactory

## 2020-03-12 NOTE — Telephone Encounter (Signed)
Ok to refill once but in future ask primary md  if she has one

## 2020-03-12 NOTE — Telephone Encounter (Signed)
Refill done for 90 days. Next refill forward to primary doctor per Dr.SEthi note listed below.

## 2020-03-16 ENCOUNTER — Other Ambulatory Visit: Payer: Self-pay

## 2020-03-16 MED ORDER — ATORVASTATIN CALCIUM 40 MG PO TABS: 40.0000 mg | ORAL_TABLET | Freq: Every day | ORAL | 0 refills | Status: DC

## 2020-03-17 ENCOUNTER — Other Ambulatory Visit: Payer: Self-pay

## 2020-03-17 ENCOUNTER — Ambulatory Visit: Payer: BC Managed Care – PPO

## 2020-03-17 DIAGNOSIS — M6281 Muscle weakness (generalized): Secondary | ICD-10-CM | POA: Diagnosis not present

## 2020-03-17 DIAGNOSIS — R278 Other lack of coordination: Secondary | ICD-10-CM | POA: Diagnosis not present

## 2020-03-17 DIAGNOSIS — R471 Dysarthria and anarthria: Secondary | ICD-10-CM | POA: Diagnosis not present

## 2020-03-17 NOTE — Therapy (Signed)
[facility] [address] [place], [place], [address] Phone: [phone]   Fax:  [phone]  Speech Language Pathology Treatment  Patient Details  Name: Glenda Hines MRN: [id number] Date of Birth: 1985-12-11 Referring Provider (SLP): Royal Hawthorn., MD   Encounter Date: 03/17/2020  End of Session - 03/17/20 1526    Visit Number  8    Number of Visits  17    Date for SLP Re-Evaluation  05/01/20    SLP Start Time  1451   4 minutes late   SLP Stop Time   1518    SLP Time Calculation (min)  27 min    Activity Tolerance  Patient tolerated treatment well       Past Medical History:  Diagnosis Date  . Pituitary tumor   . SVT (supraventricular tachycardia) ([facility])     Past Surgical History:  Procedure Laterality Date  . BUBBLE STUDY  01/23/2020   Procedure: BUBBLE STUDY;  Surgeon: Josue Hector, MD;  Location: [facility] ENDOSCOPY;  Service: Cardiovascular;;  . catheter ablation    . IR CT HEAD LTD  01/21/2020  . IR PERCUTANEOUS ART THROMBECTOMY/INFUSION INTRACRANIAL INC DIAG ANGIO  01/21/2020  . KNEE SURGERY    . RADIOLOGY WITH ANESTHESIA N/A 01/21/2020   Procedure: RADIOLOGY WITH ANESTHESIA;  Surgeon: Radiologist, Medication, MD;  Location: [facility];  Service: Radiology;  Laterality: N/A;  . TEE WITHOUT CARDIOVERSION N/A 01/23/2020   Procedure: TRANSESOPHAGEAL ECHOCARDIOGRAM (TEE);  Surgeon: Josue Hector, MD;  Location: [facility] ENDOSCOPY;  Service: Cardiovascular;  Laterality: N/A;  . TONSILLECTOMY      There were no vitals filed for this visit.  Subjective Assessment - 03/17/20 1452    Subjective  Pt's vocal intonation sounds more WNL to SLP today than last session. Pt states hypothesis is PFO and birth control caused pt's CVA. Pt to get second opinion at [facility] clinic in [place] in July.   Currently in Pain?  No/denies            ADULT SLP TREATMENT - 03/17/20 1453      General Information   Behavior/Cognition   Alert;Cooperative;Pleasant mood      Treatment Provided   Treatment provided  Cognitive-Linquistic      Cognitive-Linquistic Treatment   Treatment focused on  Dysarthria    Skilled Treatment  "I can tell a difference doing (the exercises) versus not doing them (the last couple weeks)." Pt has been less-frequent with HEP consistency - will get back to recommended frequency. Pt labial strength in HEP appears improved, as does her intonation. Pt reports still having to focus on overarticulation in order to have WNL articulation, but can be difficult when she is speaking more quickly due to emotion/interest in topic. Pt agrees d/c next session is appropriate if pt at same status as today. SLP told pt to cont to do HEP until approx 05-29-20 if she wants to make sure she has improved muscle strength to the extent she can, then she can reduce frequency to x2-3/week (once a day). Pt without furhter questions today, which were solicited by SLP at end of session.      Assessment / Recommendations / Plan   Plan  Other (Comment)   likely d/c next visit     Progression Toward Goals   Progression toward goals  Progressing toward goals       SLP Education - 03/17/20 1525    Education Details  near 05-30-20 decr HEP frequency to x2-3/week  Person(s) Educated  Patient    Methods  Explanation    Comprehension  Verbalized understanding       SLP Short Term Goals - 03/03/20 1717      SLP SHORT TERM GOAL #1   Title  pt will complete HEP (pitch, articulation) with rare min A over 2 sessions    Baseline  02-20-20    Status  Achieved      SLP SHORT TERM GOAL #2   Title  pt will demo higher pitch range than ~330Hz  in therapy tasks    Status  Deferred   added to [facility] Term Goals - 03/17/20 1529      SLP LONG TERM GOAL #1   Title  pt to perform HEP for dysarthria with independence in 3 sessions    Baseline  02-25-20, 03-03-20    Period  --   or 17 sessions, for all LTGs   Status   Achieved      SLP LONG TERM GOAL #2   Title  pt will report incr in pitch range/intonation compared to first session of ST (digital recording will be taken in first 1-2 ST sessions)    Time  4    Period  Weeks    Status  On-going      SLP LONG TERM GOAL #3   Title  pt will have further testing PRN re: difficulty expressing herself verbally    Status  Deferred      SLP LONG TERM GOAL #4   Title  pt will produce 100% intelligible speech in 25 minutees mod complex conversation x 3 sessions    Baseline  03-03-20, 03-17-20    Time  3    Period  Weeks    Status  On-going      SLP LONG TERM GOAL #5   Title  pt QOL will be higher than her original report in the first days of ST    Baseline  84 on 02-14-20    Time  4    Period  Weeks    Status  On-going       Plan - 03/17/20 1527    Clinical Impression Statement  Pt presents today with 100% intelligibility (minor loss of oral ROM), and better vocal pitch range than previous ST. See "skilled intervention" for details. Pt would cont to benefit from a program of sklled ST, for one more session, focusing on improving oral muscle stregnth, and increasing speech clarity in conversation at once/week.    Speech Therapy Frequency  --   at least once every other week   Duration  --   8 weeks or 17 total sessions   Treatment/Interventions  Oral motor exercises;Functional tasks;Compensatory techniques;SLP instruction and feedback;Patient/family education;Internal/external aids;Cueing hierarchy   possible voice exercises   Potential to Achieve Goals  Good    Consulted and Agree with Plan of Care  Patient       Patient will benefit from skilled therapeutic intervention in order to improve the following deficits and impairments:   Dysarthria and anarthria    Problem List Patient Active Problem List   Diagnosis Date Noted  . PFO (patent foramen ovale)   . Left-sided weakness   . Cryptogenic stroke ([place]) 01/21/2020    [facility] ,[place],  [place]  03/17/2020, 3:30 PM  [facility] [address] [place], [place], [address] Phone: [phone]   Fax:  [phone]   Name: Glenda Hines  MRN: [id number] Date of Birth: Jun 24, 1986

## 2020-03-26 ENCOUNTER — Encounter: Payer: BC Managed Care – PPO | Admitting: Speech Pathology

## 2020-03-31 ENCOUNTER — Encounter: Payer: BC Managed Care – PPO | Admitting: Speech Pathology

## 2020-04-02 ENCOUNTER — Encounter: Payer: BC Managed Care – PPO | Admitting: Speech Pathology

## 2020-04-06 ENCOUNTER — Ambulatory Visit: Payer: Self-pay | Admitting: Cardiovascular Disease

## 2020-04-07 ENCOUNTER — Ambulatory Visit: Payer: BC Managed Care – PPO

## 2020-04-07 ENCOUNTER — Other Ambulatory Visit: Payer: Self-pay

## 2020-04-07 DIAGNOSIS — R471 Dysarthria and anarthria: Secondary | ICD-10-CM | POA: Diagnosis not present

## 2020-04-07 DIAGNOSIS — R278 Other lack of coordination: Secondary | ICD-10-CM | POA: Diagnosis not present

## 2020-04-07 DIAGNOSIS — M6281 Muscle weakness (generalized): Secondary | ICD-10-CM | POA: Diagnosis not present

## 2020-04-07 NOTE — Therapy (Signed)
[facility] [address] [place], [place], [address] Phone: [phone]   Fax:  [phone]  Speech Language Pathology Treatment/discharge summary  Patient Details  Name: Glenda Hines MRN: [id number] Date of Birth: [birth year] Referring Provider (SLP): Royal Hawthorn., MD   Encounter Date: 04/07/2020   End of Session - 04/07/20 1720    Visit Number 9    Number of Visits 17    Date for SLP Re-Evaluation 05/01/20    SLP Start Time 0814    SLP Stop Time  1525    SLP Time Calculation (min) 34 min    Activity Tolerance Patient tolerated treatment well           Past Medical History:  Diagnosis Date   Pituitary tumor    SVT (supraventricular tachycardia) ([place])     Past Surgical History:  Procedure Laterality Date   BUBBLE STUDY  01/23/2020   Procedure: BUBBLE STUDY;  Surgeon: Josue Hector, MD;  Location: [facility];  Service: Cardiovascular;;   catheter ablation     IR CT HEAD LTD  01/21/2020   IR PERCUTANEOUS ART THROMBECTOMY/INFUSION INTRACRANIAL INC DIAG ANGIO  01/21/2020   KNEE SURGERY     RADIOLOGY WITH ANESTHESIA N/A 01/21/2020   Procedure: RADIOLOGY WITH ANESTHESIA;  Surgeon: Radiologist, Medication, MD;  Location: [facility];  Service: Radiology;  Laterality: N/A;   TEE WITHOUT CARDIOVERSION N/A 01/23/2020   Procedure: TRANSESOPHAGEAL ECHOCARDIOGRAM (TEE);  Surgeon: Josue Hector, MD;  Location[facility] ENDOSCOPY;  Service: Cardiovascular;  Laterality: N/A;   TONSILLECTOMY      There were no vitals filed for this visit.   Subjective Assessment - 04/07/20 1719    Subjective Pt's vocal intonation improved today compared to previous session.                 ADULT SLP TREATMENT - 04/07/20 1710      General Information   Behavior/Cognition Alert;Cooperative;Pleasant mood      Treatment Provided   Treatment provided Cognitive-Linquistic      Cognitive-Linquistic Treatment   Treatment focused  on Dysarthria    Skilled Treatment Pt arrives today with reported improvement in intonation, and an 80-90% improvement with lateralization of oral musculature, reports newly-felt weaker feeling in submental area with phonemes /t, d, l,n/. SLP told pt to focus on this aspect of isometrics HEP and disregard "back of tongue", and "lips" as well if desired. Pt maintained 100% intelligibility in 25 minutes conversation in and out of the therapy room, and a speech QOL that was improved to 28 - goal met. Pt performed her HEP without cues.       Assessment / Recommendations / Plan   Plan --   d/c today - pt met goals     Progression Toward Goals   Progression toward goals --   d/c day- see goal summary             SLP Short Term Goals - 03/03/20 1717      SLP SHORT TERM GOAL #1   Title pt will complete HEP (pitch, articulation) with rare min A over 2 sessions    Baseline 02-20-20    Status Achieved      SLP SHORT TERM GOAL #2   Title pt will demo higher pitch range than ~330Hz  in therapy tasks    Status Deferred   added to Economy Term Goals - 04/07/20 1512  SLP LONG TERM GOAL #1   Title pt to perform HEP for dysarthria with independence in 3 sessions    Baseline 02-25-20, 03-03-20    Period --   or 17 sessions, for all LTGs   Status Achieved      SLP LONG TERM GOAL #2   Title pt will report incr in pitch range/intonation compared to first session of ST (digital recording will be taken in first 1-2 ST sessions)    Status Achieved      SLP LONG TERM GOAL #3   Title pt will have further testing PRN re: difficulty expressing herself verbally    Status Deferred      SLP LONG TERM GOAL #4   Title pt will produce 100% intelligible speech in 25 minutees mod complex conversation x 3 sessions    Status Achieved      SLP LONG TERM GOAL #5   Title pt QOL will be higher than her original report in the first days of ST    Baseline 84 on 02-14-20    Status Achieved              Plan - 04/07/20 1721    Clinical Impression Statement Pt presents today with 100% intelligibility and better vocal pitch range than visit on 03-17-20. See "skilled intervention" for details. Pt agrees with d/c today.    Speech Therapy Frequency --   at least once every other week   Duration --   8 weeks or 17 total sessions   Treatment/Interventions Oral motor exercises;Functional tasks;Compensatory techniques;SLP instruction and feedback;Patient/family education;Internal/external aids;Cueing hierarchy   possible voice exercises   Potential to Achieve Goals Good    Consulted and Agree with Plan of Care Patient           Patient will benefit from skilled therapeutic intervention in order to improve the following deficits and impairments:   Dysarthria and anarthria   SPEECH THERAPY DISCHARGE SUMMARY  Visits from Start of Care: 9  Current functional level related to goals / functional outcomes: See goals above. Pt met all goals.  Pt reports still unable to sing in "the middle range" - if this still occurs in ~6 weeks, ENT consult is recommended, which SLP told pt today.   Remaining speech deficits: Mild right sided oral motor difference, mild (but definitely improved) hypointonational pattern in conversation.   Education / Equipment: HEP, compensations for intonation and articulation.  Plan: Patient agrees to discharge.  Patient goals were met. Patient is being discharged due to meeting the stated rehab goals.  ?????       Problem List Patient Active Problem List   Diagnosis Date Noted   PFO (patent foramen ovale)    Left-sided weakness    Cryptogenic stroke ([place]) 01/21/2020    [facility] ,[place], CCC-SLP  04/07/2020, 5:22 PM  [facility] [address] [place], [place], [address] Phone: [phone]   Fax:  [phone]   Name: Glenda Hines MRN: [id number] Date of Birth: 03/09/1986

## 2020-04-27 ENCOUNTER — Telehealth: Payer: Self-pay | Admitting: Cardiovascular Disease

## 2020-04-27 NOTE — Telephone Encounter (Signed)
Patient has a hole in her heart. She followed up with Dr. Burt Knack in May and is now to follow up with the [facility] in a couple of weeks to discuss possibly having a procedure to fix the hole in her heart. She is wanting to get a tattoo and wants to know if there is any reason she should not get one prior to going to the [facility].

## 2020-04-28 ENCOUNTER — Other Ambulatory Visit: Payer: Self-pay

## 2020-04-28 NOTE — Telephone Encounter (Signed)
I don't know of any contraindications for tattoos

## 2020-04-28 NOTE — Patient Outreach (Signed)
[facility]) Care Management  04/28/2020  Suzann Lazaro 1985-12-29 [id number]  First telephone outreach attempt to obtain mRS. No answer. Left message for returned call.  Ina Homes [facility] Management Assistant [phone]

## 2020-04-28 NOTE — Telephone Encounter (Signed)
Informed patient we do not know of any contraindications for tattoos, but she may bleed a little more than usual since she is on ASA. She was grateful for call back.

## 2020-05-04 DIAGNOSIS — Z20822 Contact with and (suspected) exposure to covid-19: Secondary | ICD-10-CM | POA: Diagnosis not present

## 2020-05-05 ENCOUNTER — Other Ambulatory Visit: Payer: Self-pay

## 2020-05-05 DIAGNOSIS — Q211 Atrial septal defect: Secondary | ICD-10-CM | POA: Diagnosis not present

## 2020-05-05 DIAGNOSIS — H547 Unspecified visual loss: Secondary | ICD-10-CM | POA: Diagnosis not present

## 2020-05-05 DIAGNOSIS — Z8673 Personal history of transient ischemic attack (TIA), and cerebral infarction without residual deficits: Secondary | ICD-10-CM | POA: Diagnosis not present

## 2020-05-05 NOTE — Patient Outreach (Signed)
[facility]) Care Management  05/05/2020  Seraphine Gudiel 04-Jun-1986 [id number]  Second telephone outreach attempt to obtain mRS. No answer. Left message for returned call.  Ina Homes [facility] Management Assistant [phone]

## 2020-05-06 DIAGNOSIS — E785 Hyperlipidemia, unspecified: Secondary | ICD-10-CM | POA: Diagnosis not present

## 2020-05-06 DIAGNOSIS — Q211 Atrial septal defect: Secondary | ICD-10-CM | POA: Diagnosis not present

## 2020-05-07 ENCOUNTER — Other Ambulatory Visit: Payer: Self-pay

## 2020-05-07 NOTE — Patient Outreach (Signed)
[facility]) Care Management  05/07/2020  Glenda Hines [birth year] [id number]   Telephone outreach to patient to obtain mRS was successfully completed. MRS=0  CMA spoke with both patient and patient's mother as the two were concerned about medical records and retrieval needed for patient's follow up with [facility].   CMA shared though not aware of the full process, call would be documented for future reference as well as apologized for any inconvenience.  Ina Homes [facility] Management Assistant [phone]

## 2020-05-08 DIAGNOSIS — Q211 Atrial septal defect: Secondary | ICD-10-CM | POA: Diagnosis not present

## 2020-05-11 DIAGNOSIS — H04123 Dry eye syndrome of bilateral lacrimal glands: Secondary | ICD-10-CM | POA: Diagnosis not present

## 2020-05-11 DIAGNOSIS — H40013 Open angle with borderline findings, low risk, bilateral: Secondary | ICD-10-CM | POA: Diagnosis not present

## 2020-05-11 DIAGNOSIS — H547 Unspecified visual loss: Secondary | ICD-10-CM | POA: Diagnosis not present

## 2020-05-12 DIAGNOSIS — F419 Anxiety disorder, unspecified: Secondary | ICD-10-CM | POA: Diagnosis not present

## 2020-05-12 DIAGNOSIS — E785 Hyperlipidemia, unspecified: Secondary | ICD-10-CM | POA: Diagnosis not present

## 2020-05-12 DIAGNOSIS — Z8673 Personal history of transient ischemic attack (TIA), and cerebral infarction without residual deficits: Secondary | ICD-10-CM | POA: Diagnosis not present

## 2020-05-12 DIAGNOSIS — Z7982 Long term (current) use of aspirin: Secondary | ICD-10-CM | POA: Diagnosis not present

## 2020-05-12 DIAGNOSIS — I440 Atrioventricular block, first degree: Secondary | ICD-10-CM | POA: Diagnosis not present

## 2020-05-12 DIAGNOSIS — I471 Supraventricular tachycardia: Secondary | ICD-10-CM | POA: Diagnosis not present

## 2020-05-12 DIAGNOSIS — R55 Syncope and collapse: Secondary | ICD-10-CM | POA: Diagnosis not present

## 2020-05-12 DIAGNOSIS — Q211 Atrial septal defect: Secondary | ICD-10-CM | POA: Diagnosis not present

## 2020-05-12 DIAGNOSIS — R9431 Abnormal electrocardiogram [ECG] [EKG]: Secondary | ICD-10-CM | POA: Diagnosis not present

## 2020-05-12 DIAGNOSIS — R61 Generalized hyperhidrosis: Secondary | ICD-10-CM | POA: Diagnosis not present

## 2020-05-18 ENCOUNTER — Telehealth (HOSPITAL_COMMUNITY): Payer: Self-pay | Admitting: *Deleted

## 2020-05-18 DIAGNOSIS — D352 Benign neoplasm of pituitary gland: Secondary | ICD-10-CM | POA: Diagnosis not present

## 2020-05-18 DIAGNOSIS — J45909 Unspecified asthma, uncomplicated: Secondary | ICD-10-CM | POA: Diagnosis not present

## 2020-05-18 DIAGNOSIS — E785 Hyperlipidemia, unspecified: Secondary | ICD-10-CM | POA: Diagnosis not present

## 2020-05-18 DIAGNOSIS — R7989 Other specified abnormal findings of blood chemistry: Secondary | ICD-10-CM | POA: Diagnosis not present

## 2020-05-18 NOTE — Telephone Encounter (Signed)
Received medical records from Dr Cindee Lame at the [facility] clinic for this pt interest in participating in Cardiac Rehab s/p PFO Closure on 8/3. Noted that pt lives in [place].  Called to verify pt preference of location. Pt would like to participate here at [place].  Pt has upcoming appt today with her PCP Dr. Cher Nakai.  Pt will return in 6 months to see the [facility] Cardiologist. Pt does not have a local cardiologist. Advised pt that it would benefit her establishing cardiology care locally. Pt will talk to her PCP today about this.  Advised pt will send to her PCP MD referral form to sign and complete with the understanding that he will be our contact for any issues that may occur during her exercise session. Requested copy of progress note when completed and 12 lead ekg if warranted. Pt aware will forward to support staff for insurance verification. Cherre Huger, BSN Cardiac and Training and development officer

## 2020-05-25 DIAGNOSIS — I491 Atrial premature depolarization: Secondary | ICD-10-CM | POA: Diagnosis not present

## 2020-05-25 DIAGNOSIS — I440 Atrioventricular block, first degree: Secondary | ICD-10-CM | POA: Diagnosis not present

## 2020-05-26 ENCOUNTER — Telehealth (HOSPITAL_COMMUNITY): Payer: Self-pay

## 2020-05-26 NOTE — Telephone Encounter (Signed)
Pt insurance is active and benefits verified through [place]. Co-pay $0.00, DED $7,550.00/$7,550.00 met, out of pocket $8,550.00/$8,550.00 met, co-insurance 50%. No pre-authorization required. Passport, 05/26/20 @ 11:22AM, ITV#[id number]  Will contact patient to see if she is interested in the Cardiac Rehab Program. Patient will be contacted for scheduling upon review by the RN Navigator.

## 2020-05-29 DIAGNOSIS — Z20822 Contact with and (suspected) exposure to covid-19: Secondary | ICD-10-CM | POA: Diagnosis not present

## 2020-06-01 DIAGNOSIS — Z20822 Contact with and (suspected) exposure to covid-19: Secondary | ICD-10-CM | POA: Diagnosis not present

## 2020-06-01 DIAGNOSIS — E785 Hyperlipidemia, unspecified: Secondary | ICD-10-CM | POA: Diagnosis not present

## 2020-06-01 DIAGNOSIS — J45909 Unspecified asthma, uncomplicated: Secondary | ICD-10-CM | POA: Diagnosis not present

## 2020-06-01 DIAGNOSIS — I679 Cerebrovascular disease, unspecified: Secondary | ICD-10-CM | POA: Diagnosis not present

## 2020-06-01 DIAGNOSIS — D352 Benign neoplasm of pituitary gland: Secondary | ICD-10-CM | POA: Diagnosis not present

## 2020-06-01 DIAGNOSIS — R519 Headache, unspecified: Secondary | ICD-10-CM | POA: Diagnosis not present

## 2020-06-01 DIAGNOSIS — B349 Viral infection, unspecified: Secondary | ICD-10-CM | POA: Diagnosis not present

## 2020-06-02 DIAGNOSIS — I440 Atrioventricular block, first degree: Secondary | ICD-10-CM | POA: Diagnosis not present

## 2020-06-02 DIAGNOSIS — I491 Atrial premature depolarization: Secondary | ICD-10-CM | POA: Diagnosis not present

## 2020-06-04 ENCOUNTER — Telehealth (HOSPITAL_COMMUNITY): Payer: Self-pay | Admitting: *Deleted

## 2020-06-04 DIAGNOSIS — G43909 Migraine, unspecified, not intractable, without status migrainosus: Secondary | ICD-10-CM | POA: Diagnosis not present

## 2020-06-04 DIAGNOSIS — J45909 Unspecified asthma, uncomplicated: Secondary | ICD-10-CM | POA: Diagnosis not present

## 2020-06-04 DIAGNOSIS — E785 Hyperlipidemia, unspecified: Secondary | ICD-10-CM | POA: Diagnosis not present

## 2020-06-04 DIAGNOSIS — D352 Benign neoplasm of pituitary gland: Secondary | ICD-10-CM | POA: Diagnosis not present

## 2020-06-04 NOTE — Telephone Encounter (Signed)
Received progress report from pt follow up visit from 8/9. Noted that pt will discuss cardiac rehab in 1-2 weeks.  Called and spoke with pt regarding the date of her next appt with Dr. Truman Hayward.  Pt will see Dr. Truman Hayward on 9/10.  Pt would like to go ahead and get scheduled.  Advise pt that due to Dr. Truman Hayward writing on the fax returned to me "Ask pt to discuss with me next visit 1-2 weeks".  I needed to wait.  Review of the progress note revealed no information regarding cardiac rehab.  Migraine headache was the subject of this visit.  Pt will see Dr. Leonie Man on 9/20.  Pt plans to contact Dr. Truman Hayward office to see if she could get the okay to proceed.  [place] the department contact information. Cherre Huger, BSN Cardiac and Training and development officer

## 2020-06-19 ENCOUNTER — Telehealth (HOSPITAL_COMMUNITY): Payer: Self-pay

## 2020-06-19 NOTE — Telephone Encounter (Signed)
Called patient to see if she was interested in participating in the Cardiac Rehab Program. Patient stated yes. Patient will come in for orientation on 07/02/20 @ 930AM and will attend the 315PM exercise class.  Mailed letter.

## 2020-06-22 ENCOUNTER — Telehealth (HOSPITAL_COMMUNITY): Payer: Self-pay

## 2020-06-22 NOTE — Telephone Encounter (Signed)
Cardiac Rehab Medication Review by a Pharmacist  Does the patient  feel that his/her medications are working for him/her?  yes  Has the patient been experiencing any side effects to the medications prescribed?  no  Does the patient measure his/her own blood pressure or blood glucose at home?  no   Does the patient have any problems obtaining medications due to transportation or finances?   no  Understanding of regimen: excellent Understanding of indications: excellent Potential of compliance: good  Pharmacist Comments: Patient taking Atorvastatin 20 mg daily not 40 mg; medication record updated appropriately.    Claudina Lick, PharmD PGY1 Acute Care Pharmacy Resident 06/22/2020 4:12 PM  Please check [email or web address] for unit-specific pharmacy phone numbers.

## 2020-06-29 ENCOUNTER — Telehealth (HOSPITAL_COMMUNITY): Payer: Self-pay

## 2020-06-29 ENCOUNTER — Ambulatory Visit: Payer: BC Managed Care – PPO | Admitting: Neurology

## 2020-06-29 ENCOUNTER — Telehealth (HOSPITAL_COMMUNITY): Payer: Self-pay | Admitting: Internal Medicine

## 2020-06-29 NOTE — Telephone Encounter (Signed)
Cardiac Rehab Note:  Unsuccessful telephone encounter to Doneshia Hill to confirm cardiac rehab orientation appointment 07/02/20 at 9:30 am. Hipaa compliant VM message left requesting call back to [phone].  Evy Lutterman E. Rollene Rotunda RN, BSN [place]. [facility]  Cardiac and Pulmonary Rehabilitation Phone: [phone] Fax: [phone]

## 2020-07-02 ENCOUNTER — Ambulatory Visit (HOSPITAL_COMMUNITY): Payer: BC Managed Care – PPO

## 2020-07-03 DIAGNOSIS — E785 Hyperlipidemia, unspecified: Secondary | ICD-10-CM | POA: Diagnosis not present

## 2020-07-03 DIAGNOSIS — J45909 Unspecified asthma, uncomplicated: Secondary | ICD-10-CM | POA: Diagnosis not present

## 2020-07-03 DIAGNOSIS — E559 Vitamin D deficiency, unspecified: Secondary | ICD-10-CM | POA: Diagnosis not present

## 2020-07-03 DIAGNOSIS — D352 Benign neoplasm of pituitary gland: Secondary | ICD-10-CM | POA: Diagnosis not present

## 2020-07-06 ENCOUNTER — Ambulatory Visit (HOSPITAL_COMMUNITY): Payer: BC Managed Care – PPO

## 2020-07-08 ENCOUNTER — Ambulatory Visit (HOSPITAL_COMMUNITY): Payer: BC Managed Care – PPO

## 2020-07-10 ENCOUNTER — Ambulatory Visit (HOSPITAL_COMMUNITY): Payer: BC Managed Care – PPO

## 2020-07-13 ENCOUNTER — Ambulatory Visit (HOSPITAL_COMMUNITY): Payer: BC Managed Care – PPO

## 2020-07-15 ENCOUNTER — Ambulatory Visit (HOSPITAL_COMMUNITY): Payer: BC Managed Care – PPO

## 2020-07-15 DIAGNOSIS — I471 Supraventricular tachycardia: Secondary | ICD-10-CM | POA: Diagnosis not present

## 2020-07-15 DIAGNOSIS — Z8673 Personal history of transient ischemic attack (TIA), and cerebral infarction without residual deficits: Secondary | ICD-10-CM | POA: Diagnosis not present

## 2020-07-15 DIAGNOSIS — E785 Hyperlipidemia, unspecified: Secondary | ICD-10-CM | POA: Diagnosis not present

## 2020-07-15 DIAGNOSIS — I999 Unspecified disorder of circulatory system: Secondary | ICD-10-CM | POA: Diagnosis not present

## 2020-07-15 DIAGNOSIS — Z9889 Other specified postprocedural states: Secondary | ICD-10-CM | POA: Diagnosis not present

## 2020-07-17 ENCOUNTER — Ambulatory Visit (HOSPITAL_COMMUNITY): Payer: BC Managed Care – PPO

## 2020-07-17 DIAGNOSIS — E785 Hyperlipidemia, unspecified: Secondary | ICD-10-CM | POA: Diagnosis not present

## 2020-07-17 DIAGNOSIS — I471 Supraventricular tachycardia: Secondary | ICD-10-CM | POA: Diagnosis not present

## 2020-07-17 DIAGNOSIS — I999 Unspecified disorder of circulatory system: Secondary | ICD-10-CM | POA: Diagnosis not present

## 2020-07-17 DIAGNOSIS — Z9889 Other specified postprocedural states: Secondary | ICD-10-CM | POA: Diagnosis not present

## 2020-07-17 DIAGNOSIS — Z8673 Personal history of transient ischemic attack (TIA), and cerebral infarction without residual deficits: Secondary | ICD-10-CM | POA: Diagnosis not present

## 2020-07-20 ENCOUNTER — Ambulatory Visit (HOSPITAL_COMMUNITY): Payer: BC Managed Care – PPO

## 2020-07-20 DIAGNOSIS — E785 Hyperlipidemia, unspecified: Secondary | ICD-10-CM | POA: Diagnosis not present

## 2020-07-20 DIAGNOSIS — I471 Supraventricular tachycardia: Secondary | ICD-10-CM | POA: Diagnosis not present

## 2020-07-20 DIAGNOSIS — Z9889 Other specified postprocedural states: Secondary | ICD-10-CM | POA: Diagnosis not present

## 2020-07-20 DIAGNOSIS — Z8673 Personal history of transient ischemic attack (TIA), and cerebral infarction without residual deficits: Secondary | ICD-10-CM | POA: Diagnosis not present

## 2020-07-20 DIAGNOSIS — I999 Unspecified disorder of circulatory system: Secondary | ICD-10-CM | POA: Diagnosis not present

## 2020-07-22 ENCOUNTER — Ambulatory Visit (HOSPITAL_COMMUNITY): Payer: BC Managed Care – PPO

## 2020-07-22 DIAGNOSIS — Z9889 Other specified postprocedural states: Secondary | ICD-10-CM | POA: Diagnosis not present

## 2020-07-22 DIAGNOSIS — I999 Unspecified disorder of circulatory system: Secondary | ICD-10-CM | POA: Diagnosis not present

## 2020-07-22 DIAGNOSIS — E785 Hyperlipidemia, unspecified: Secondary | ICD-10-CM | POA: Diagnosis not present

## 2020-07-22 DIAGNOSIS — Z8673 Personal history of transient ischemic attack (TIA), and cerebral infarction without residual deficits: Secondary | ICD-10-CM | POA: Diagnosis not present

## 2020-07-22 DIAGNOSIS — I471 Supraventricular tachycardia: Secondary | ICD-10-CM | POA: Diagnosis not present

## 2020-07-24 ENCOUNTER — Ambulatory Visit (HOSPITAL_COMMUNITY): Payer: BC Managed Care – PPO

## 2020-07-24 DIAGNOSIS — E785 Hyperlipidemia, unspecified: Secondary | ICD-10-CM | POA: Diagnosis not present

## 2020-07-24 DIAGNOSIS — I471 Supraventricular tachycardia: Secondary | ICD-10-CM | POA: Diagnosis not present

## 2020-07-24 DIAGNOSIS — Z9889 Other specified postprocedural states: Secondary | ICD-10-CM | POA: Diagnosis not present

## 2020-07-24 DIAGNOSIS — I999 Unspecified disorder of circulatory system: Secondary | ICD-10-CM | POA: Diagnosis not present

## 2020-07-24 DIAGNOSIS — Z8673 Personal history of transient ischemic attack (TIA), and cerebral infarction without residual deficits: Secondary | ICD-10-CM | POA: Diagnosis not present

## 2020-07-27 ENCOUNTER — Ambulatory Visit (HOSPITAL_COMMUNITY): Payer: BC Managed Care – PPO

## 2020-07-27 DIAGNOSIS — Z9889 Other specified postprocedural states: Secondary | ICD-10-CM | POA: Diagnosis not present

## 2020-07-27 DIAGNOSIS — Z8673 Personal history of transient ischemic attack (TIA), and cerebral infarction without residual deficits: Secondary | ICD-10-CM | POA: Diagnosis not present

## 2020-07-27 DIAGNOSIS — I471 Supraventricular tachycardia: Secondary | ICD-10-CM | POA: Diagnosis not present

## 2020-07-27 DIAGNOSIS — E785 Hyperlipidemia, unspecified: Secondary | ICD-10-CM | POA: Diagnosis not present

## 2020-07-27 DIAGNOSIS — I999 Unspecified disorder of circulatory system: Secondary | ICD-10-CM | POA: Diagnosis not present

## 2020-07-29 ENCOUNTER — Ambulatory Visit (HOSPITAL_COMMUNITY): Payer: BC Managed Care – PPO

## 2020-07-31 ENCOUNTER — Ambulatory Visit (HOSPITAL_COMMUNITY): Payer: BC Managed Care – PPO

## 2020-08-03 ENCOUNTER — Ambulatory Visit (HOSPITAL_COMMUNITY): Payer: BC Managed Care – PPO

## 2020-08-03 DIAGNOSIS — E785 Hyperlipidemia, unspecified: Secondary | ICD-10-CM | POA: Diagnosis not present

## 2020-08-03 DIAGNOSIS — I471 Supraventricular tachycardia: Secondary | ICD-10-CM | POA: Diagnosis not present

## 2020-08-03 DIAGNOSIS — Z8673 Personal history of transient ischemic attack (TIA), and cerebral infarction without residual deficits: Secondary | ICD-10-CM | POA: Diagnosis not present

## 2020-08-03 DIAGNOSIS — I999 Unspecified disorder of circulatory system: Secondary | ICD-10-CM | POA: Diagnosis not present

## 2020-08-03 DIAGNOSIS — Z9889 Other specified postprocedural states: Secondary | ICD-10-CM | POA: Diagnosis not present

## 2020-08-05 ENCOUNTER — Ambulatory Visit (HOSPITAL_COMMUNITY): Payer: BC Managed Care – PPO

## 2020-08-07 ENCOUNTER — Ambulatory Visit (HOSPITAL_COMMUNITY): Payer: BC Managed Care – PPO

## 2020-08-10 ENCOUNTER — Ambulatory Visit (HOSPITAL_COMMUNITY): Payer: BC Managed Care – PPO

## 2020-08-11 ENCOUNTER — Institutional Professional Consult (permissible substitution): Payer: BC Managed Care – PPO | Admitting: Neurology

## 2020-08-12 ENCOUNTER — Ambulatory Visit (HOSPITAL_COMMUNITY): Payer: BC Managed Care – PPO

## 2020-08-12 ENCOUNTER — Ambulatory Visit: Payer: BC Managed Care – PPO | Admitting: Neurology

## 2020-08-12 ENCOUNTER — Encounter: Payer: Self-pay | Admitting: Neurology

## 2020-08-12 VITALS — BP 120/60 | HR 80 | Ht 68.0 in | Wt 167.0 lb

## 2020-08-12 DIAGNOSIS — Q211 Atrial septal defect: Secondary | ICD-10-CM | POA: Diagnosis not present

## 2020-08-12 DIAGNOSIS — I6990 Unspecified sequelae of unspecified cerebrovascular disease: Secondary | ICD-10-CM | POA: Diagnosis not present

## 2020-08-12 DIAGNOSIS — Q2112 Patent foramen ovale: Secondary | ICD-10-CM

## 2020-08-12 NOTE — Progress Notes (Signed)
[facility] [address] [place]. [place] [address] [phone]       OFFICE FOLLOW-UP NOTE  [name] Date of Birth:  01/23/1986 Medical Record Number:  [id number]   HPI: Initial visit 03/11/2020: Glenda Hines is a pleasant 34 year old Caucasian lady seen today for initial office follow-up visit following hospital admission for stroke in April 2021.  She is accompanied by her mother.  History is obtained from them, review of electronic medical records and I personally reviewed imaging films in PACS.  She has no significant past medical history.  She presented on 01/21/2020 with last seen normal being 2200 hrs. the night prior.  She woke up at 8 in the morning and did not feel right and noticed facial droop right gaze deviation and left arm weakness.  She is unable to stand when she tried to get out of bed.  EMS was called.  She was brought in as a code stroke she was noted as having right gaze preference left-sided neglect and left facial droop and left arm weakness.  She did not receive TPA as she was a wake-up stroke.  NIH stroke scale was 10.  CT angiogram of the head and neck showed no emergent large vessel occlusion but there was a irregular luminal thrombus of the right M1 segment causing moderate stenosis with a right M3 branch occlusion adjacent to the acute infarct by CT perfusion with 130 cc of delayed/ischemic blood flow mainly in the upper division of the right MCA and right ACA territory.  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  After discussing risk-benefit with the patient and family patient was taken for emergent mechanical thrombectomy and underwent complete recanalization of the right pericallosal artery with no change after attempted recanalization of the right M3 anterior division.  Patient had a small right frontal subarachnoid hemorrhage post procedure.  She was kept in the neuro ICU with blood pressure tightly controlled with close  neurological monitoring and did well.  Patient gave a history of prior history of 3 miscarriages but no deep vein thrombosis.  She was on birth control pills for 6 weeks prior to her stroke and it just stopped a few days ago.  She had prior history of palpitation and had been seen at [facility] and had ablation for supraventricular tachycardia.  She still had palpitations after that but it was unclear if she had any history of A. fib.  She was seen by electrophysiology team who recommended loop recorder for long-term monitoring but the patient refused and instead underwent outpatient 30-day heart monitor which was negative for A. fib.  She did very well and at the time of discharge had an NIH stroke scale of 4 with mild left-sided neglect and left grip and hand weakness.  Hypercoagulable lab panel was negative.  ANA and ESR were normal.  TEE showed no definite cardiac source of embolism except for small patent foramen ovale.  Her calculated rope score was 8 with 84% probability of PFO being related to her stroke.  She was seen by Dr. Sherren Mocha interventional cardiologist as outpatient for endovascular PFO closure but the patient prefers to get a second opinion at [facility] where she had previous ablation performed for PSVT and has a schedule in the next few weeks.  She is done extremely well and states she has made full neurological recovery.  When she is tired she feels the left and fine motor skills may be slightly diminished.  She is tolerating  aspirin well without side effects.  She is also on Lipitor which is tolerating well but has not had follow-up lipid profile checked yet.  She has had no other recurrent stroke or TIA symptoms. Update 08/12/2020 : She returns for follow-up after last visit 5 months ago.  She states she is done well.  She has had no recurrent stroke or TIA symptoms.  She had lipid profile checked at last visit it was satisfactory with LDL being 47 mg percent.  She had her PFO  endovascularly closed at [facility] in [place] on 05/12/2020 by Dr. Alfonzo Beers.  Procedure went well without any complications.  She was on aspirin and Plavix for a week and is now on aspirin alone and tolerating it well without bruising or bleeding.  She remains on Lipitor and tolerating it well without muscle aches and pains.  Blood pressures well controlled today it is 120/60.  Interestingly she had a increased flurry of migraines in the week after the closure but since then has had no more migraines for the last 2 months.  She has no new complaints today. ROS:   14 system review of systems is positive for left hand weakness, diminished dexterity, tiredness and all other systems negative  PMH:  Past Medical History:  Diagnosis Date  . Pituitary tumor   . SVT (supraventricular tachycardia) ([place])     Social History:  Social History   Socioeconomic History  . Marital status: Married    Spouse name: Not on file  . Number of children: Not on file  . Years of education: Not on file  . Highest education level: Not on file  Occupational History  . Not on file  Tobacco Use  . Smoking status: Never Smoker  . Smokeless tobacco: Never Used  Substance and Sexual Activity  . Alcohol use: No  . Drug use: No  . Sexual activity: Never  Other Topics Concern  . Not on file  Social History Narrative  . Not on file   Social Determinants of Health   Financial Resource Strain:   . Difficulty of Paying Living Expenses: Not on file  Food Insecurity:   . Worried About Charity fundraiser in the Last Year: Not on file  . Ran Out of Food in the Last Year: Not on file  Transportation Needs:   . Lack of Transportation (Medical): Not on file  . Lack of Transportation (Non-Medical): Not on file  Physical Activity:   . Days of Exercise per Week: Not on file  . Minutes of Exercise per Session: Not on file  Stress:   . Feeling of Stress : Not on file  Social Connections:   . Frequency of  Communication with Friends and Family: Not on file  . Frequency of Social Gatherings with Friends and Family: Not on file  . Attends Religious Services: Not on file  . Active Member of Clubs or Organizations: Not on file  . Attends Archivist Meetings: Not on file  . Marital Status: Not on file  Intimate Partner Violence:   . Fear of Current or Ex-Partner: Not on file  . Emotionally Abused: Not on file  . Physically Abused: Not on file  . Sexually Abused: Not on file    Medications:   Current Outpatient Medications on File Prior to Visit  Medication Sig Dispense Refill  . aspirin EC 81 MG EC tablet Take 1 tablet (81 mg total) by mouth daily.    Marland Kitchen atorvastatin (LIPITOR)  20 MG tablet Take 20 mg by mouth daily.    . Multiple Vitamin (MULTIVITAMIN ADULT PO) Take by mouth.     No current facility-administered medications on file prior to visit.    Allergies:   Allergies  Allergen Reactions  . Oxycodone Shortness Of Breath  . Vancomycin Rash  . Squid Oil Nausea And Vomiting    Squid   . Sulfa Antibiotics Rash  . Tape Rash    Physical Exam General: well developed, well nourished pleasant young Caucasian lady, seated, in no evident distress Head: head normocephalic and atraumatic.  Neck: supple with no carotid or supraclavicular bruits Cardiovascular: regular rate and rhythm, no murmurs Musculoskeletal: no deformity Skin:  no rash/petichiae Vascular:  Normal pulses all extremities Vitals:   08/12/20 1103  BP: 120/60  Pulse: 80   Neurologic Exam Mental Status: Awake and fully alert. Oriented to place and time. Recent and remote memory intact. Attention span, concentration and fund of knowledge appropriate. Mood and affect appropriate.  Cranial Nerves: Fundoscopic exam not done. Pupils equal, briskly reactive to light. Extraocular movements full without nystagmus. Visual fields full to confrontation. Hearing intact. Facial sensation intact. Face, tongue, palate moves  normally and symmetrically.  Motor: Normal bulk and tone. Normal strength in all tested extremity muscles.  Slightly diminished fine finger movements on the left.  Orbits right over left upper extremity. Sensory.: intact to touch ,pinprick .position and vibratory sensation.  Coordination: Rapid alternating movements normal in all extremities. Finger-to-nose and heel-to-shin performed accurately bilaterally. Gait and Station: Arises from chair without difficulty. Stance is normal. Gait demonstrates normal stride length and balance . Able to heel, toe and tandem walk without difficulty.  Reflexes: 1+ and symmetric. Toes downgoing.   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  Vascular risk factors of hyperlipidemia and birth control pills and PFO s/p endovascular closure on 05/12/2020 at [facility].     PLAN: I had a long d/w patient about her cryptogenic  stroke, recent endovascular PFO closure, risk for recurrent stroke/TIAs, personally independently reviewed imaging studies and stroke evaluation results and answered questions.Continue aspirin 81 mg daily  for secondary stroke prevention and maintain strict control of hypertension with blood pressure goal below 130/90, diabetes with hemoglobin A1c goal below 6.5% and lipids with LDL cholesterol goal below 70 mg/dL. I also advised the patient to eat a healthy diet with plenty of whole grains, cereals, fruits and vegetables, exercise regularly and maintain ideal body weight .patient is planning on getting pregnant I recommend she stop Lipitor prior to her planned pregnancy but continue aspirin.  Followup in the future with me as needed only and no schedule appointment was made. Greater than 50% of time during this 25 minute visit was spent on counseling,explanation of diagnosis of  cryptogenic stroke, PFO, planning of further management, discussion with patient and family and coordination of care Antony Contras, MD  [facility] [address] [place], [place] [address]  Phone [phone] Fax [phone] Note: This document was prepared with digital dictation and possible smart phrase technology. Any transcriptional errors that result from this process are unintentional

## 2020-08-12 NOTE — Patient Instructions (Signed)
I had a long d/w patient about her cryptogenic  stroke, recent endovascular PFO closure, risk for recurrent stroke/TIAs, personally independently reviewed imaging studies and stroke evaluation results and answered questions.Continue aspirin 81 mg daily  for secondary stroke prevention and maintain strict control of hypertension with blood pressure goal below 130/90, diabetes with hemoglobin A1c goal below 6.5% and lipids with LDL cholesterol goal below 70 mg/dL. I also advised the patient to eat a healthy diet with plenty of whole grains, cereals, fruits and vegetables, exercise regularly and maintain ideal body weight .patient is planning on getting pregnant I recommend she stop Lipitor prior to her planned pregnancy but continue aspirin.  Followup in the future with me as needed only and no schedule appointment was made.

## 2020-08-14 ENCOUNTER — Ambulatory Visit (HOSPITAL_COMMUNITY): Payer: BC Managed Care – PPO

## 2020-08-14 DIAGNOSIS — Q211 Atrial septal defect: Secondary | ICD-10-CM | POA: Diagnosis not present

## 2020-08-17 ENCOUNTER — Ambulatory Visit (HOSPITAL_COMMUNITY): Payer: BC Managed Care – PPO

## 2020-08-17 DIAGNOSIS — Q211 Atrial septal defect: Secondary | ICD-10-CM | POA: Diagnosis not present

## 2020-08-19 ENCOUNTER — Ambulatory Visit (HOSPITAL_COMMUNITY): Payer: BC Managed Care – PPO

## 2020-08-21 ENCOUNTER — Ambulatory Visit (HOSPITAL_COMMUNITY): Payer: BC Managed Care – PPO

## 2020-08-21 DIAGNOSIS — Q211 Atrial septal defect: Secondary | ICD-10-CM | POA: Diagnosis not present

## 2020-08-24 ENCOUNTER — Ambulatory Visit (HOSPITAL_COMMUNITY): Payer: BC Managed Care – PPO

## 2020-08-24 DIAGNOSIS — Q211 Atrial septal defect: Secondary | ICD-10-CM | POA: Diagnosis not present

## 2020-08-26 ENCOUNTER — Ambulatory Visit (HOSPITAL_COMMUNITY): Payer: BC Managed Care – PPO

## 2020-08-26 DIAGNOSIS — Q211 Atrial septal defect: Secondary | ICD-10-CM | POA: Diagnosis not present

## 2020-08-26 DIAGNOSIS — Z20822 Contact with and (suspected) exposure to covid-19: Secondary | ICD-10-CM | POA: Diagnosis not present

## 2020-08-28 ENCOUNTER — Ambulatory Visit (HOSPITAL_COMMUNITY): Payer: BC Managed Care – PPO

## 2020-08-31 DIAGNOSIS — Q211 Atrial septal defect: Secondary | ICD-10-CM | POA: Diagnosis not present

## 2020-09-02 DIAGNOSIS — Q211 Atrial septal defect: Secondary | ICD-10-CM | POA: Diagnosis not present

## 2020-09-07 DIAGNOSIS — Q211 Atrial septal defect: Secondary | ICD-10-CM | POA: Diagnosis not present

## 2020-09-11 DIAGNOSIS — F419 Anxiety disorder, unspecified: Secondary | ICD-10-CM | POA: Diagnosis not present

## 2020-09-11 DIAGNOSIS — Q211 Atrial septal defect: Secondary | ICD-10-CM | POA: Diagnosis not present

## 2020-09-11 DIAGNOSIS — Z8673 Personal history of transient ischemic attack (TIA), and cerebral infarction without residual deficits: Secondary | ICD-10-CM | POA: Diagnosis not present

## 2020-09-11 DIAGNOSIS — E785 Hyperlipidemia, unspecified: Secondary | ICD-10-CM | POA: Diagnosis not present

## 2020-09-14 DIAGNOSIS — Z8673 Personal history of transient ischemic attack (TIA), and cerebral infarction without residual deficits: Secondary | ICD-10-CM | POA: Diagnosis not present

## 2020-09-14 DIAGNOSIS — I679 Cerebrovascular disease, unspecified: Secondary | ICD-10-CM | POA: Diagnosis not present

## 2020-09-14 DIAGNOSIS — J0100 Acute maxillary sinusitis, unspecified: Secondary | ICD-10-CM | POA: Diagnosis not present

## 2020-09-14 DIAGNOSIS — Q211 Atrial septal defect: Secondary | ICD-10-CM | POA: Diagnosis not present

## 2020-09-14 DIAGNOSIS — F419 Anxiety disorder, unspecified: Secondary | ICD-10-CM | POA: Diagnosis not present

## 2020-09-14 DIAGNOSIS — D352 Benign neoplasm of pituitary gland: Secondary | ICD-10-CM | POA: Diagnosis not present

## 2020-09-14 DIAGNOSIS — E785 Hyperlipidemia, unspecified: Secondary | ICD-10-CM | POA: Diagnosis not present

## 2020-09-14 DIAGNOSIS — J45909 Unspecified asthma, uncomplicated: Secondary | ICD-10-CM | POA: Diagnosis not present

## 2020-09-16 DIAGNOSIS — Q211 Atrial septal defect: Secondary | ICD-10-CM | POA: Diagnosis not present

## 2020-09-16 DIAGNOSIS — Z8673 Personal history of transient ischemic attack (TIA), and cerebral infarction without residual deficits: Secondary | ICD-10-CM | POA: Diagnosis not present

## 2020-09-16 DIAGNOSIS — E785 Hyperlipidemia, unspecified: Secondary | ICD-10-CM | POA: Diagnosis not present

## 2020-09-16 DIAGNOSIS — F419 Anxiety disorder, unspecified: Secondary | ICD-10-CM | POA: Diagnosis not present

## 2020-09-21 DIAGNOSIS — E785 Hyperlipidemia, unspecified: Secondary | ICD-10-CM | POA: Diagnosis not present

## 2020-09-21 DIAGNOSIS — F419 Anxiety disorder, unspecified: Secondary | ICD-10-CM | POA: Diagnosis not present

## 2020-09-21 DIAGNOSIS — Q211 Atrial septal defect: Secondary | ICD-10-CM | POA: Diagnosis not present

## 2020-09-21 DIAGNOSIS — Z8673 Personal history of transient ischemic attack (TIA), and cerebral infarction without residual deficits: Secondary | ICD-10-CM | POA: Diagnosis not present

## 2020-09-23 DIAGNOSIS — Z8673 Personal history of transient ischemic attack (TIA), and cerebral infarction without residual deficits: Secondary | ICD-10-CM | POA: Diagnosis not present

## 2020-09-23 DIAGNOSIS — E785 Hyperlipidemia, unspecified: Secondary | ICD-10-CM | POA: Diagnosis not present

## 2020-09-23 DIAGNOSIS — F419 Anxiety disorder, unspecified: Secondary | ICD-10-CM | POA: Diagnosis not present

## 2020-09-23 DIAGNOSIS — Q211 Atrial septal defect: Secondary | ICD-10-CM | POA: Diagnosis not present

## 2020-09-25 DIAGNOSIS — Q211 Atrial septal defect: Secondary | ICD-10-CM | POA: Diagnosis not present

## 2020-09-25 DIAGNOSIS — Z8673 Personal history of transient ischemic attack (TIA), and cerebral infarction without residual deficits: Secondary | ICD-10-CM | POA: Diagnosis not present

## 2020-09-25 DIAGNOSIS — F419 Anxiety disorder, unspecified: Secondary | ICD-10-CM | POA: Diagnosis not present

## 2020-09-25 DIAGNOSIS — E785 Hyperlipidemia, unspecified: Secondary | ICD-10-CM | POA: Diagnosis not present

## 2020-10-01 DIAGNOSIS — E785 Hyperlipidemia, unspecified: Secondary | ICD-10-CM | POA: Diagnosis not present

## 2020-10-01 DIAGNOSIS — R7989 Other specified abnormal findings of blood chemistry: Secondary | ICD-10-CM | POA: Diagnosis not present

## 2020-10-01 DIAGNOSIS — J45909 Unspecified asthma, uncomplicated: Secondary | ICD-10-CM | POA: Diagnosis not present

## 2020-10-01 DIAGNOSIS — J0100 Acute maxillary sinusitis, unspecified: Secondary | ICD-10-CM | POA: Diagnosis not present

## 2020-10-19 DIAGNOSIS — Q211 Atrial septal defect: Secondary | ICD-10-CM | POA: Diagnosis not present

## 2020-10-19 DIAGNOSIS — Z8673 Personal history of transient ischemic attack (TIA), and cerebral infarction without residual deficits: Secondary | ICD-10-CM | POA: Diagnosis not present

## 2020-10-19 DIAGNOSIS — F419 Anxiety disorder, unspecified: Secondary | ICD-10-CM | POA: Diagnosis not present

## 2020-10-19 DIAGNOSIS — E785 Hyperlipidemia, unspecified: Secondary | ICD-10-CM | POA: Diagnosis not present

## 2020-10-21 DIAGNOSIS — Z8673 Personal history of transient ischemic attack (TIA), and cerebral infarction without residual deficits: Secondary | ICD-10-CM | POA: Diagnosis not present

## 2020-10-21 DIAGNOSIS — Q211 Atrial septal defect: Secondary | ICD-10-CM | POA: Diagnosis not present

## 2020-10-21 DIAGNOSIS — F419 Anxiety disorder, unspecified: Secondary | ICD-10-CM | POA: Diagnosis not present

## 2020-10-21 DIAGNOSIS — E785 Hyperlipidemia, unspecified: Secondary | ICD-10-CM | POA: Diagnosis not present

## 2020-10-23 DIAGNOSIS — F419 Anxiety disorder, unspecified: Secondary | ICD-10-CM | POA: Diagnosis not present

## 2020-10-23 DIAGNOSIS — E785 Hyperlipidemia, unspecified: Secondary | ICD-10-CM | POA: Diagnosis not present

## 2020-10-23 DIAGNOSIS — D352 Benign neoplasm of pituitary gland: Secondary | ICD-10-CM | POA: Diagnosis not present

## 2020-10-23 DIAGNOSIS — J45909 Unspecified asthma, uncomplicated: Secondary | ICD-10-CM | POA: Diagnosis not present

## 2020-10-30 DIAGNOSIS — D225 Melanocytic nevi of trunk: Secondary | ICD-10-CM | POA: Diagnosis not present

## 2020-10-30 DIAGNOSIS — D485 Neoplasm of uncertain behavior of skin: Secondary | ICD-10-CM | POA: Diagnosis not present

## 2020-11-03 ENCOUNTER — Institutional Professional Consult (permissible substitution): Payer: BC Managed Care – PPO | Admitting: Neurology

## 2020-11-04 DIAGNOSIS — E785 Hyperlipidemia, unspecified: Secondary | ICD-10-CM | POA: Diagnosis not present

## 2020-11-04 DIAGNOSIS — I639 Cerebral infarction, unspecified: Secondary | ICD-10-CM | POA: Diagnosis not present

## 2020-11-04 DIAGNOSIS — Z8774 Personal history of (corrected) congenital malformations of heart and circulatory system: Secondary | ICD-10-CM | POA: Diagnosis not present

## 2020-11-04 DIAGNOSIS — Z8673 Personal history of transient ischemic attack (TIA), and cerebral infarction without residual deficits: Secondary | ICD-10-CM | POA: Diagnosis not present

## 2020-11-04 DIAGNOSIS — Z09 Encounter for follow-up examination after completed treatment for conditions other than malignant neoplasm: Secondary | ICD-10-CM | POA: Diagnosis not present

## 2020-11-04 DIAGNOSIS — R55 Syncope and collapse: Secondary | ICD-10-CM | POA: Diagnosis not present

## 2020-11-11 DIAGNOSIS — Z8673 Personal history of transient ischemic attack (TIA), and cerebral infarction without residual deficits: Secondary | ICD-10-CM | POA: Diagnosis not present

## 2020-11-11 DIAGNOSIS — E785 Hyperlipidemia, unspecified: Secondary | ICD-10-CM | POA: Diagnosis not present

## 2020-11-11 DIAGNOSIS — F419 Anxiety disorder, unspecified: Secondary | ICD-10-CM | POA: Diagnosis not present

## 2020-11-11 DIAGNOSIS — Q211 Atrial septal defect: Secondary | ICD-10-CM | POA: Diagnosis not present

## 2020-11-13 DIAGNOSIS — E785 Hyperlipidemia, unspecified: Secondary | ICD-10-CM | POA: Diagnosis not present

## 2020-11-13 DIAGNOSIS — F419 Anxiety disorder, unspecified: Secondary | ICD-10-CM | POA: Diagnosis not present

## 2020-11-13 DIAGNOSIS — Z8673 Personal history of transient ischemic attack (TIA), and cerebral infarction without residual deficits: Secondary | ICD-10-CM | POA: Diagnosis not present

## 2020-11-13 DIAGNOSIS — Q211 Atrial septal defect: Secondary | ICD-10-CM | POA: Diagnosis not present

## 2020-11-13 DIAGNOSIS — Z20828 Contact with and (suspected) exposure to other viral communicable diseases: Secondary | ICD-10-CM | POA: Diagnosis not present

## 2020-11-16 DIAGNOSIS — Q211 Atrial septal defect: Secondary | ICD-10-CM | POA: Diagnosis not present

## 2020-11-16 DIAGNOSIS — E785 Hyperlipidemia, unspecified: Secondary | ICD-10-CM | POA: Diagnosis not present

## 2020-11-16 DIAGNOSIS — Z8673 Personal history of transient ischemic attack (TIA), and cerebral infarction without residual deficits: Secondary | ICD-10-CM | POA: Diagnosis not present

## 2020-11-16 DIAGNOSIS — F419 Anxiety disorder, unspecified: Secondary | ICD-10-CM | POA: Diagnosis not present

## 2020-11-18 DIAGNOSIS — F419 Anxiety disorder, unspecified: Secondary | ICD-10-CM | POA: Diagnosis not present

## 2020-11-18 DIAGNOSIS — Z8673 Personal history of transient ischemic attack (TIA), and cerebral infarction without residual deficits: Secondary | ICD-10-CM | POA: Diagnosis not present

## 2020-11-18 DIAGNOSIS — E785 Hyperlipidemia, unspecified: Secondary | ICD-10-CM | POA: Diagnosis not present

## 2020-11-18 DIAGNOSIS — Q211 Atrial septal defect: Secondary | ICD-10-CM | POA: Diagnosis not present

## 2020-11-20 DIAGNOSIS — F419 Anxiety disorder, unspecified: Secondary | ICD-10-CM | POA: Diagnosis not present

## 2020-11-20 DIAGNOSIS — Q211 Atrial septal defect: Secondary | ICD-10-CM | POA: Diagnosis not present

## 2020-11-20 DIAGNOSIS — Z8673 Personal history of transient ischemic attack (TIA), and cerebral infarction without residual deficits: Secondary | ICD-10-CM | POA: Diagnosis not present

## 2020-11-20 DIAGNOSIS — E785 Hyperlipidemia, unspecified: Secondary | ICD-10-CM | POA: Diagnosis not present

## 2020-11-27 DIAGNOSIS — F419 Anxiety disorder, unspecified: Secondary | ICD-10-CM | POA: Diagnosis not present

## 2020-11-27 DIAGNOSIS — E785 Hyperlipidemia, unspecified: Secondary | ICD-10-CM | POA: Diagnosis not present

## 2020-11-27 DIAGNOSIS — Q211 Atrial septal defect: Secondary | ICD-10-CM | POA: Diagnosis not present

## 2020-11-27 DIAGNOSIS — Z8673 Personal history of transient ischemic attack (TIA), and cerebral infarction without residual deficits: Secondary | ICD-10-CM | POA: Diagnosis not present

## 2020-12-02 DIAGNOSIS — Z8673 Personal history of transient ischemic attack (TIA), and cerebral infarction without residual deficits: Secondary | ICD-10-CM | POA: Diagnosis not present

## 2020-12-02 DIAGNOSIS — F419 Anxiety disorder, unspecified: Secondary | ICD-10-CM | POA: Diagnosis not present

## 2020-12-02 DIAGNOSIS — E785 Hyperlipidemia, unspecified: Secondary | ICD-10-CM | POA: Diagnosis not present

## 2020-12-02 DIAGNOSIS — Q211 Atrial septal defect: Secondary | ICD-10-CM | POA: Diagnosis not present

## 2020-12-08 DIAGNOSIS — Z6825 Body mass index (BMI) 25.0-25.9, adult: Secondary | ICD-10-CM | POA: Diagnosis not present

## 2020-12-08 DIAGNOSIS — F419 Anxiety disorder, unspecified: Secondary | ICD-10-CM | POA: Diagnosis not present

## 2020-12-08 DIAGNOSIS — J0100 Acute maxillary sinusitis, unspecified: Secondary | ICD-10-CM | POA: Diagnosis not present

## 2020-12-08 DIAGNOSIS — J45909 Unspecified asthma, uncomplicated: Secondary | ICD-10-CM | POA: Diagnosis not present

## 2020-12-11 DIAGNOSIS — Q211 Atrial septal defect: Secondary | ICD-10-CM | POA: Diagnosis not present

## 2020-12-11 DIAGNOSIS — F419 Anxiety disorder, unspecified: Secondary | ICD-10-CM | POA: Diagnosis not present

## 2020-12-11 DIAGNOSIS — E785 Hyperlipidemia, unspecified: Secondary | ICD-10-CM | POA: Diagnosis not present

## 2020-12-11 DIAGNOSIS — Z8673 Personal history of transient ischemic attack (TIA), and cerebral infarction without residual deficits: Secondary | ICD-10-CM | POA: Diagnosis not present

## 2020-12-14 DIAGNOSIS — F419 Anxiety disorder, unspecified: Secondary | ICD-10-CM | POA: Diagnosis not present

## 2020-12-14 DIAGNOSIS — Q211 Atrial septal defect: Secondary | ICD-10-CM | POA: Diagnosis not present

## 2020-12-14 DIAGNOSIS — E785 Hyperlipidemia, unspecified: Secondary | ICD-10-CM | POA: Diagnosis not present

## 2020-12-14 DIAGNOSIS — Z8673 Personal history of transient ischemic attack (TIA), and cerebral infarction without residual deficits: Secondary | ICD-10-CM | POA: Diagnosis not present

## 2020-12-16 DIAGNOSIS — Q211 Atrial septal defect: Secondary | ICD-10-CM | POA: Diagnosis not present

## 2020-12-16 DIAGNOSIS — F419 Anxiety disorder, unspecified: Secondary | ICD-10-CM | POA: Diagnosis not present

## 2020-12-16 DIAGNOSIS — Z8673 Personal history of transient ischemic attack (TIA), and cerebral infarction without residual deficits: Secondary | ICD-10-CM | POA: Diagnosis not present

## 2020-12-16 DIAGNOSIS — E785 Hyperlipidemia, unspecified: Secondary | ICD-10-CM | POA: Diagnosis not present

## 2020-12-18 DIAGNOSIS — Z8673 Personal history of transient ischemic attack (TIA), and cerebral infarction without residual deficits: Secondary | ICD-10-CM | POA: Diagnosis not present

## 2020-12-18 DIAGNOSIS — E785 Hyperlipidemia, unspecified: Secondary | ICD-10-CM | POA: Diagnosis not present

## 2020-12-18 DIAGNOSIS — F419 Anxiety disorder, unspecified: Secondary | ICD-10-CM | POA: Diagnosis not present

## 2020-12-18 DIAGNOSIS — Q211 Atrial septal defect: Secondary | ICD-10-CM | POA: Diagnosis not present

## 2020-12-21 DIAGNOSIS — Q211 Atrial septal defect: Secondary | ICD-10-CM | POA: Diagnosis not present

## 2020-12-21 DIAGNOSIS — Z8673 Personal history of transient ischemic attack (TIA), and cerebral infarction without residual deficits: Secondary | ICD-10-CM | POA: Diagnosis not present

## 2020-12-21 DIAGNOSIS — F419 Anxiety disorder, unspecified: Secondary | ICD-10-CM | POA: Diagnosis not present

## 2020-12-21 DIAGNOSIS — E785 Hyperlipidemia, unspecified: Secondary | ICD-10-CM | POA: Diagnosis not present

## 2021-01-15 DIAGNOSIS — J45909 Unspecified asthma, uncomplicated: Secondary | ICD-10-CM | POA: Diagnosis not present

## 2021-01-15 DIAGNOSIS — G4733 Obstructive sleep apnea (adult) (pediatric): Secondary | ICD-10-CM | POA: Diagnosis not present

## 2021-01-15 DIAGNOSIS — E785 Hyperlipidemia, unspecified: Secondary | ICD-10-CM | POA: Diagnosis not present

## 2021-01-15 DIAGNOSIS — F419 Anxiety disorder, unspecified: Secondary | ICD-10-CM | POA: Diagnosis not present

## 2021-01-25 DIAGNOSIS — F419 Anxiety disorder, unspecified: Secondary | ICD-10-CM | POA: Diagnosis not present

## 2021-01-25 DIAGNOSIS — J45909 Unspecified asthma, uncomplicated: Secondary | ICD-10-CM | POA: Diagnosis not present

## 2021-01-25 DIAGNOSIS — J0100 Acute maxillary sinusitis, unspecified: Secondary | ICD-10-CM | POA: Diagnosis not present

## 2021-01-25 DIAGNOSIS — Z6825 Body mass index (BMI) 25.0-25.9, adult: Secondary | ICD-10-CM | POA: Diagnosis not present

## 2021-01-29 DIAGNOSIS — G4733 Obstructive sleep apnea (adult) (pediatric): Secondary | ICD-10-CM | POA: Diagnosis not present

## 2021-02-18 DIAGNOSIS — F419 Anxiety disorder, unspecified: Secondary | ICD-10-CM | POA: Diagnosis not present

## 2021-02-18 DIAGNOSIS — J45909 Unspecified asthma, uncomplicated: Secondary | ICD-10-CM | POA: Diagnosis not present

## 2021-02-18 DIAGNOSIS — Z6825 Body mass index (BMI) 25.0-25.9, adult: Secondary | ICD-10-CM | POA: Diagnosis not present

## 2021-02-18 DIAGNOSIS — J0100 Acute maxillary sinusitis, unspecified: Secondary | ICD-10-CM | POA: Diagnosis not present

## 2021-03-16 DIAGNOSIS — Z3009 Encounter for other general counseling and advice on contraception: Secondary | ICD-10-CM | POA: Diagnosis not present

## 2021-03-16 DIAGNOSIS — Z01419 Encounter for gynecological examination (general) (routine) without abnormal findings: Secondary | ICD-10-CM | POA: Diagnosis not present

## 2021-04-07 DIAGNOSIS — F419 Anxiety disorder, unspecified: Secondary | ICD-10-CM | POA: Diagnosis not present

## 2021-04-07 DIAGNOSIS — E785 Hyperlipidemia, unspecified: Secondary | ICD-10-CM | POA: Diagnosis not present

## 2021-04-07 DIAGNOSIS — I679 Cerebrovascular disease, unspecified: Secondary | ICD-10-CM | POA: Diagnosis not present

## 2021-04-07 DIAGNOSIS — U071 COVID-19: Secondary | ICD-10-CM | POA: Diagnosis not present

## 2021-04-20 DIAGNOSIS — Z30430 Encounter for insertion of intrauterine contraceptive device: Secondary | ICD-10-CM | POA: Diagnosis not present

## 2021-04-21 DIAGNOSIS — J45909 Unspecified asthma, uncomplicated: Secondary | ICD-10-CM | POA: Diagnosis not present

## 2021-04-21 DIAGNOSIS — F419 Anxiety disorder, unspecified: Secondary | ICD-10-CM | POA: Diagnosis not present

## 2021-04-21 DIAGNOSIS — E785 Hyperlipidemia, unspecified: Secondary | ICD-10-CM | POA: Diagnosis not present

## 2021-04-21 DIAGNOSIS — D352 Benign neoplasm of pituitary gland: Secondary | ICD-10-CM | POA: Diagnosis not present

## 2021-04-21 DIAGNOSIS — L309 Dermatitis, unspecified: Secondary | ICD-10-CM | POA: Diagnosis not present

## 2021-04-22 DIAGNOSIS — Z3009 Encounter for other general counseling and advice on contraception: Secondary | ICD-10-CM | POA: Diagnosis not present

## 2021-04-22 DIAGNOSIS — Z30432 Encounter for removal of intrauterine contraceptive device: Secondary | ICD-10-CM | POA: Diagnosis not present

## 2021-04-23 DIAGNOSIS — L309 Dermatitis, unspecified: Secondary | ICD-10-CM | POA: Diagnosis not present

## 2021-04-23 DIAGNOSIS — J45909 Unspecified asthma, uncomplicated: Secondary | ICD-10-CM | POA: Diagnosis not present

## 2021-04-23 DIAGNOSIS — E785 Hyperlipidemia, unspecified: Secondary | ICD-10-CM | POA: Diagnosis not present

## 2021-04-23 DIAGNOSIS — F419 Anxiety disorder, unspecified: Secondary | ICD-10-CM | POA: Diagnosis not present

## 2021-04-26 DIAGNOSIS — R531 Weakness: Secondary | ICD-10-CM | POA: Diagnosis not present

## 2021-04-26 DIAGNOSIS — L501 Idiopathic urticaria: Secondary | ICD-10-CM | POA: Diagnosis not present

## 2021-04-29 ENCOUNTER — Encounter: Payer: Self-pay | Admitting: Allergy and Immunology

## 2021-04-29 ENCOUNTER — Ambulatory Visit: Payer: BC Managed Care – PPO | Admitting: Allergy and Immunology

## 2021-04-29 ENCOUNTER — Other Ambulatory Visit: Payer: Self-pay

## 2021-04-29 VITALS — BP 112/64 | HR 89 | Ht 67.25 in | Wt 174.0 lb

## 2021-04-29 DIAGNOSIS — T50905A Adverse effect of unspecified drugs, medicaments and biological substances, initial encounter: Secondary | ICD-10-CM

## 2021-04-29 DIAGNOSIS — L501 Idiopathic urticaria: Secondary | ICD-10-CM

## 2021-04-29 NOTE — Progress Notes (Signed)
[facility]   Follow-up Note  Referring Provider: Cher Nakai, MD Primary Provider: Cher Nakai, MD Date of Office Visit: 04/29/2021  Subjective:   Glenda Hines (DOB: Apr 22, 1986) is a 35 y.o. female who returns to the [facility] on 04/29/2021 in re-evaluation of the following:  HPI: Glenda Hines presents to this clinic in evaluation of hives.  She started developing hives on 22 April 2019 2224 hrs. after receiving IUD placement.  She subsequently had her IUD removed on 22 April 2021.  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  She continues on Plavix today.  She was given a systemic steroid injection on 21 April 2021 and also started on prednisone at that point in time which she finished today.  She has also been using various antihistamines although on a sporadic basis.  She did visit with Dr. Michele Mcalpine, dermatology, on 26 April 2021 who checked her thyroid blood tests which was reported as being normal.  Allergies as of 04/29/2021       Reactions   Oxycodone Shortness Of Breath   Vancomycin Rash   Levonorgestrel Hives   Squid Oil Nausea And Vomiting   Squid "Calamari"    Sulfa Antibiotics Rash   Tape Rash        Medication List    ALPRAZolam 0.25 MG tablet Commonly known as: XANAX Take  0.25 mg by mouth every 6 (six) hours as needed.   aspirin 81 MG EC tablet Take 1 tablet (81 mg total) by mouth daily.   atorvastatin 20 MG tablet Commonly known as: LIPITOR Take 20 mg by mouth daily.   clopidogrel 75 MG tablet Commonly known as: PLAVIX Take by mouth.   fexofenadine 180 MG tablet Commonly known as: ALLEGRA Take 180 mg by mouth daily.   hydrOXYzine 25 MG tablet Commonly known as: ATARAX/VISTARIL Take 25 mg by mouth every 6 (six) hours as needed.   MULTIVITAMIN ADULT PO Take by mouth.        Past Medical History:  Diagnosis Date   Pituitary tumor    SVT (supraventricular tachycardia) ([place])     Past Surgical History:  Procedure Laterality Date   BUBBLE STUDY  01/23/2020   Procedure: BUBBLE STUDY;  Surgeon: Josue Hector, MD;  Location: [facility];  Service: Cardiovascular;;   catheter ablation     IR CT HEAD LTD  01/21/2020   IR PERCUTANEOUS ART THROMBECTOMY/INFUSION INTRACRANIAL INC DIAG ANGIO  01/21/2020   KNEE SURGERY     RADIOLOGY WITH ANESTHESIA N/A 01/21/2020   Procedure: RADIOLOGY WITH ANESTHESIA;  Surgeon: Radiologist, Medication, MD;  Location: [facility];  Service: Radiology;  Laterality: N/A;   TEE WITHOUT CARDIOVERSION N/A 01/23/2020   Procedure: TRANSESOPHAGEAL ECHOCARDIOGRAM (TEE);  Surgeon: Josue Hector, MD;  Location: [facility];  Service: Cardiovascular;  Laterality: N/A;   TONSILLECTOMY      Review of systems negative except as noted in HPI / PMHx or noted below:  Review of Systems  Constitutional: Negative.   HENT: Negative.    Eyes: Negative.   Respiratory: Negative.    Cardiovascular: Negative.   Gastrointestinal: Negative.   Genitourinary: Negative.   Musculoskeletal: Negative.   Skin: Negative.   Neurological: Negative.   Endo/Heme/Allergies: Negative.   Psychiatric/Behavioral: Negative.      Objective:   Vitals:   04/29/21 1341  BP: 112/64  Pulse: 89  SpO2: 99%   Height: 5' 7.25" (170.8 cm)  Weight: 174 lb  (78.9 kg)   Physical Exam Constitutional:      Appearance: She is not diaphoretic.  HENT:     Head: Normocephalic.     Right Ear: Tympanic membrane, ear canal and external ear normal.     Left Ear: Tympanic membrane, ear canal and external ear normal.     Nose: Nose normal. No mucosal edema or rhinorrhea.     Mouth/Throat:     Pharynx: Uvula midline. No oropharyngeal exudate.  Eyes:     Conjunctiva/sclera: Conjunctivae normal.  Neck:     Thyroid: No thyromegaly.     Trachea: Trachea normal. No tracheal tenderness or tracheal deviation.  Cardiovascular:     Rate and Rhythm: Normal rate and regular rhythm.     Heart sounds: Normal heart sounds, S1 normal and S2 normal. No murmur heard. Pulmonary:     Effort: No respiratory distress.     Breath sounds: Normal breath sounds. No stridor. No wheezing or rales.  Lymphadenopathy:     Head:     Right side of head: No tonsillar adenopathy.     Left side of head: No tonsillar adenopathy.     Cervical: No cervical adenopathy.  Skin:    Findings: No erythema or rash.     Nails: There is no clubbing.  Neurological:     Mental Status: She is alert.    Diagnostics: none  Assessment and Plan:   1. Idiopathic urticaria   2. Adverse effect of drug, initial encounter    1.  Cetirizine 10 mg - 1-2 tablets 1-2 times per day (MAX=40mg /day)  2.  Famotidine 20 mg - 1 tablet 2 times per day  3. Discontinue Plavix. Restart ASA  4. Can add benadryl if needed  5. Further evaluation?  6. IUD placement ?  There are many potential triggers for why Glenda Hines's immune system has become extremely active manifested as urticaria.  She had a recent COVID infection, used antiviral therapy, started Plavix, and had placement of an IUD which has since been removed.  I would assume that it is the Plavix administration that is giving rise to this issue and we will eliminate this medication at this point in time and she can restart her aspirin that she was  utilizing prior to the institution of Plavix.  She can use an H1 and H2 receptor blocker.  Further evaluation will be dependent on her response to this approach.  The question of whether or not she should have IUD placement is unanswered and we will readdress this question once we see what happens over the course of the next several weeks.  Allena Katz, MD Allergy / Immunology [facility]

## 2021-04-29 NOTE — Patient Instructions (Addendum)
  1.  Cetirizine 10 mg - 1-2 tablets 1-2 times per day (MAX=40mg /day)  2.  Famotidine 20 mg - 1 tablet 2 times per day  3. Discontinue Plavix. Restart ASA  4. Can add benadryl if needed  5. Further evaluation?  6. IUD placement ?

## 2021-05-03 ENCOUNTER — Encounter: Payer: Self-pay | Admitting: Allergy and Immunology

## 2021-05-04 DIAGNOSIS — Z30430 Encounter for insertion of intrauterine contraceptive device: Secondary | ICD-10-CM | POA: Diagnosis not present

## 2021-06-01 DIAGNOSIS — Z30431 Encounter for routine checking of intrauterine contraceptive device: Secondary | ICD-10-CM | POA: Diagnosis not present

## 2021-06-01 DIAGNOSIS — B373 Candidiasis of vulva and vagina: Secondary | ICD-10-CM | POA: Diagnosis not present

## 2021-06-04 DIAGNOSIS — E785 Hyperlipidemia, unspecified: Secondary | ICD-10-CM | POA: Diagnosis not present

## 2021-06-04 DIAGNOSIS — D352 Benign neoplasm of pituitary gland: Secondary | ICD-10-CM | POA: Diagnosis not present

## 2021-06-04 DIAGNOSIS — Z1331 Encounter for screening for depression: Secondary | ICD-10-CM | POA: Diagnosis not present

## 2021-06-04 DIAGNOSIS — F419 Anxiety disorder, unspecified: Secondary | ICD-10-CM | POA: Diagnosis not present

## 2021-06-04 DIAGNOSIS — J45909 Unspecified asthma, uncomplicated: Secondary | ICD-10-CM | POA: Diagnosis not present

## 2021-06-08 DIAGNOSIS — K1379 Other lesions of oral mucosa: Secondary | ICD-10-CM | POA: Diagnosis not present

## 2021-06-11 DIAGNOSIS — J45909 Unspecified asthma, uncomplicated: Secondary | ICD-10-CM | POA: Diagnosis not present

## 2021-06-11 DIAGNOSIS — B9689 Other specified bacterial agents as the cause of diseases classified elsewhere: Secondary | ICD-10-CM | POA: Diagnosis not present

## 2021-06-11 DIAGNOSIS — B373 Candidiasis of vulva and vagina: Secondary | ICD-10-CM | POA: Diagnosis not present

## 2021-06-11 DIAGNOSIS — J028 Acute pharyngitis due to other specified organisms: Secondary | ICD-10-CM | POA: Diagnosis not present

## 2021-06-21 ENCOUNTER — Telehealth: Payer: Self-pay | Admitting: Neurology

## 2021-06-21 NOTE — Telephone Encounter (Signed)
Pt called wanting to speak to an RN regarding Covid and Blood Thinners and her stroke. Please advise.

## 2021-06-21 NOTE — Telephone Encounter (Signed)
Returned patient's call, she said she  was concerned for blood clots after having covid and asked her PCP who told her to take 4 baby aspirins at night.  She had covid back in June 2022 and did that for a little while but quit, is only taking 1 baby aspirin a day now as recommended by Dr. Leonie Man at last visit.    Patient denied further questions, verbalized understanding and expressed appreciation for the phone call.

## 2021-06-29 DIAGNOSIS — D352 Benign neoplasm of pituitary gland: Secondary | ICD-10-CM | POA: Diagnosis not present

## 2021-06-29 DIAGNOSIS — F419 Anxiety disorder, unspecified: Secondary | ICD-10-CM | POA: Diagnosis not present

## 2021-06-29 DIAGNOSIS — E785 Hyperlipidemia, unspecified: Secondary | ICD-10-CM | POA: Diagnosis not present

## 2021-06-29 DIAGNOSIS — J028 Acute pharyngitis due to other specified organisms: Secondary | ICD-10-CM | POA: Diagnosis not present

## 2021-08-10 DIAGNOSIS — Z30430 Encounter for insertion of intrauterine contraceptive device: Secondary | ICD-10-CM | POA: Diagnosis not present

## 2021-08-26 DIAGNOSIS — T8332XA Displacement of intrauterine contraceptive device, initial encounter: Secondary | ICD-10-CM | POA: Diagnosis not present

## 2021-08-26 DIAGNOSIS — Z23 Encounter for immunization: Secondary | ICD-10-CM | POA: Diagnosis not present

## 2021-08-30 DIAGNOSIS — E663 Overweight: Secondary | ICD-10-CM | POA: Diagnosis not present

## 2021-08-30 DIAGNOSIS — F419 Anxiety disorder, unspecified: Secondary | ICD-10-CM | POA: Diagnosis not present

## 2021-08-30 DIAGNOSIS — J45909 Unspecified asthma, uncomplicated: Secondary | ICD-10-CM | POA: Diagnosis not present

## 2021-08-30 DIAGNOSIS — J0110 Acute frontal sinusitis, unspecified: Secondary | ICD-10-CM | POA: Diagnosis not present

## 2021-09-07 DIAGNOSIS — Z30431 Encounter for routine checking of intrauterine contraceptive device: Secondary | ICD-10-CM | POA: Diagnosis not present

## 2021-09-15 DIAGNOSIS — F419 Anxiety disorder, unspecified: Secondary | ICD-10-CM | POA: Diagnosis not present

## 2021-09-15 DIAGNOSIS — J45909 Unspecified asthma, uncomplicated: Secondary | ICD-10-CM | POA: Diagnosis not present

## 2021-09-15 DIAGNOSIS — E663 Overweight: Secondary | ICD-10-CM | POA: Diagnosis not present

## 2021-09-15 DIAGNOSIS — R0989 Other specified symptoms and signs involving the circulatory and respiratory systems: Secondary | ICD-10-CM | POA: Diagnosis not present

## 2021-09-29 IMAGING — XA IR PERCUTANEOUS ART THORMBECTOMY/INFUSION INTRACRANIAL INCLUDE D
1 series · 14 of 24 positions shown · non-contrast
Comparison: CT/CT angiogram of the head and neck January 21, 2020.

INDICATION: 33-year-old female with past medical history is pituitary tumor,
supraventricular tachycardia and prior miscarriages. She presented
to ED the with right-sided gaze preference, left sided neglect and
left-sided facial droop. NIHSS 10. Premorbid Sherin Anahi 0. Last
seen well [DATE] on 01/20/2020. No intravenous tPA as she was outside
the window. Head CT showed loss of gray-white differentiation the
right frontal and anterior temporal lobes as well as right putamen
with hyperdense right MCA bifurcation. CT angiogram showed
subocclusive clot in the distal right M1 extending to the origin of
the right M2/superior division branch, occlusion of a right M3/MCA
middle division branch and right pericallosal artery distal to the
origin of the callosomarginal artery. CT perfusion showed no
evidence of core infarct with a a number of of 130 mL. Given
discordance between noncontrast head CT and CT perfusion, an MRI of
the brain was obtained an showed a posterior right frontal and small
parietal areas of restricted diffusion, predominantly
cortical/subcortical with minimal corresponding increased signal on
FLAIR. There was slow flow in cortical right MCA and ACA branches.
Decision was made to proceed with mechanical thrombectomy.

EXAM:
Diagnostic cerebral angiogram.
Mechanical thrombectomy.
Flat panel head CT.
TECHNIQUE: Informed written consent was obtained from the the patient and her
mother after a thorough discussion of the procedural risks, benefits
and alternatives. All questions were addressed. Maximal Sterile
Barrier Technique was utilized including caps, mask, sterile gowns,
sterile gloves, sterile drape, hand hygiene and skin antiseptic. A
timeout was performed prior to the initiation of the procedure.

[Series 14: <mpr range> · axial · 5.0mm · 0.38mm/px · z∈[-307,-143]mm · 14 of 32 slices shown]
[im 1/32]
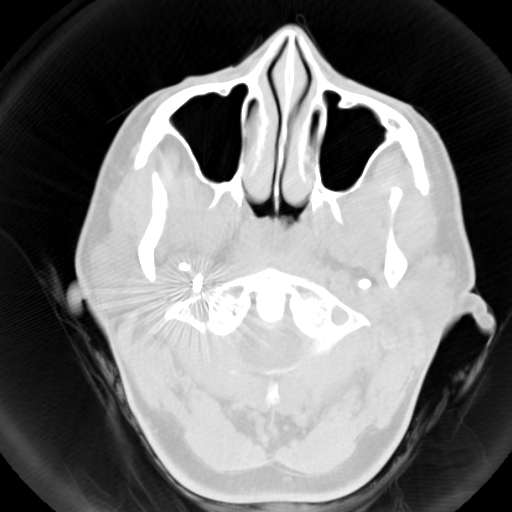
[im 3/32]
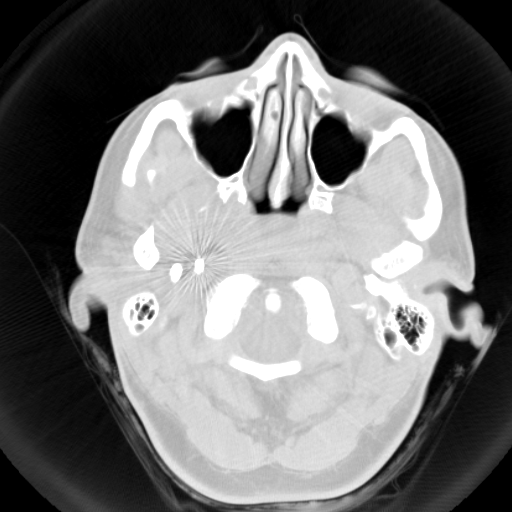
[im 6/32]
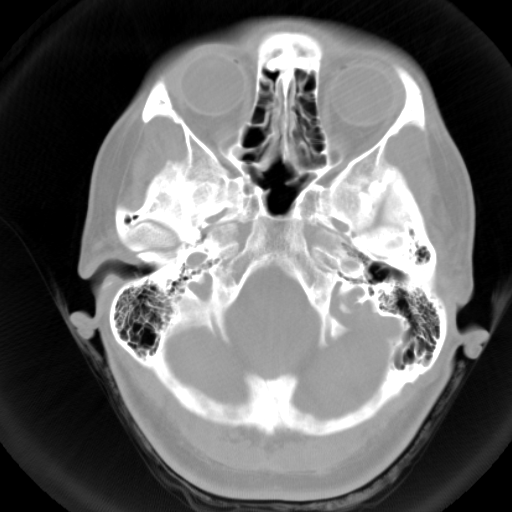
[im 9/32]
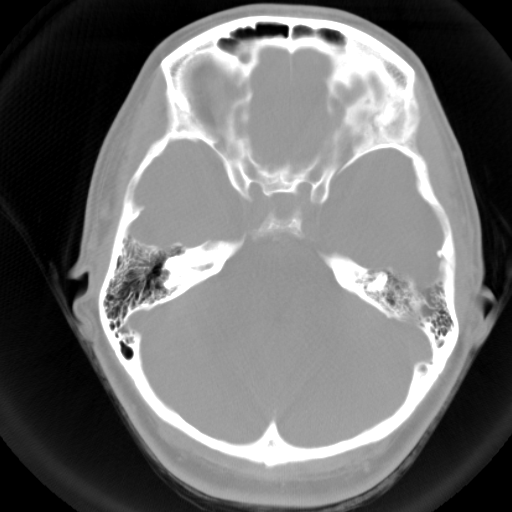
[im 10/32]
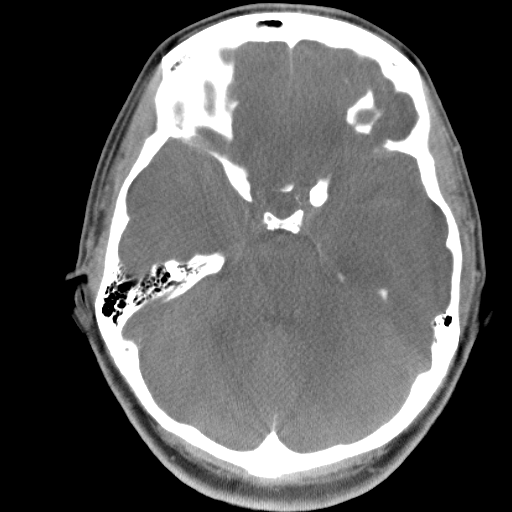
[im 13/32]
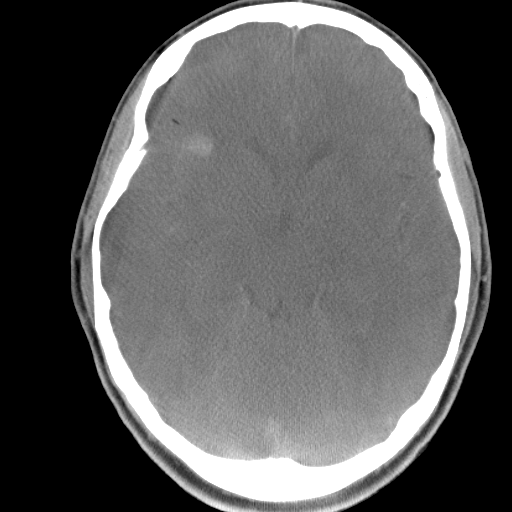
[im 15/32]
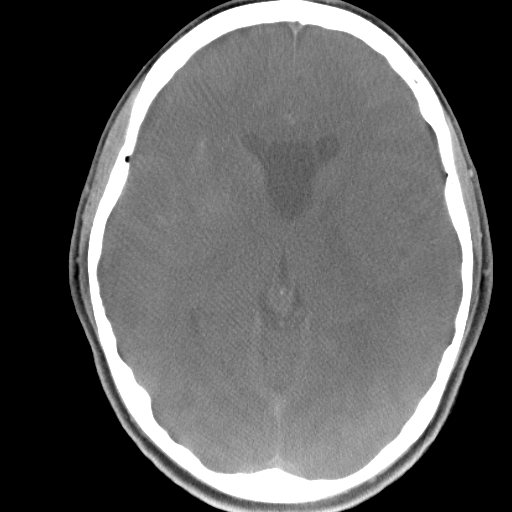
[im 17/32]
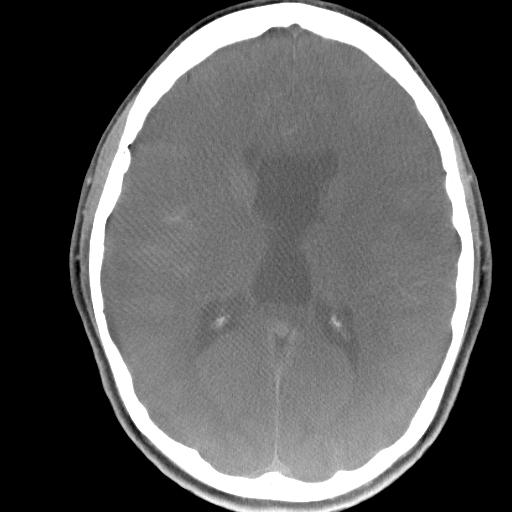
[im 19/32]
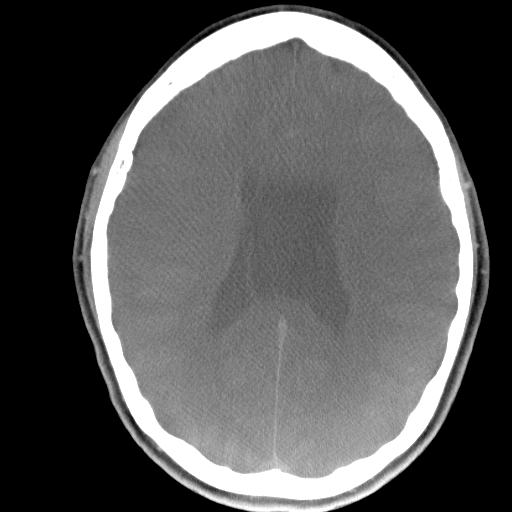
[im 22/32]
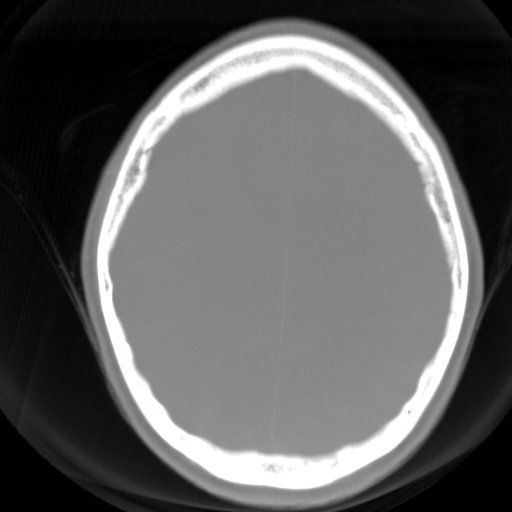
[im 25/32]
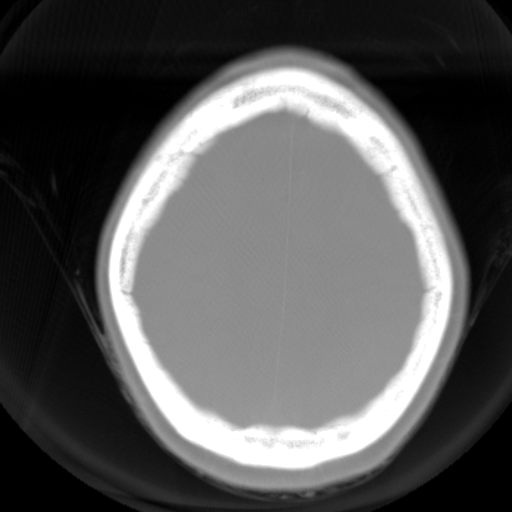
[im 26/32]
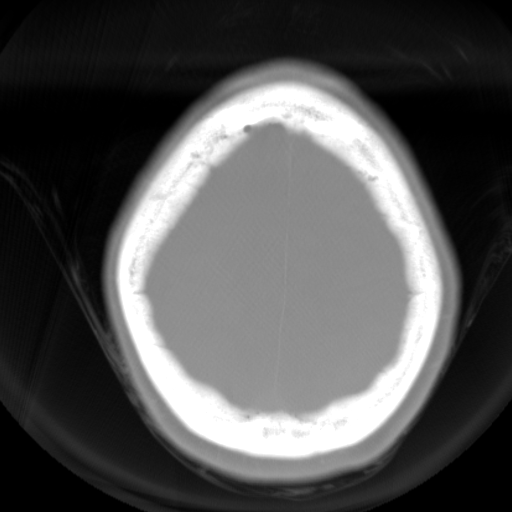
[im 29/32]
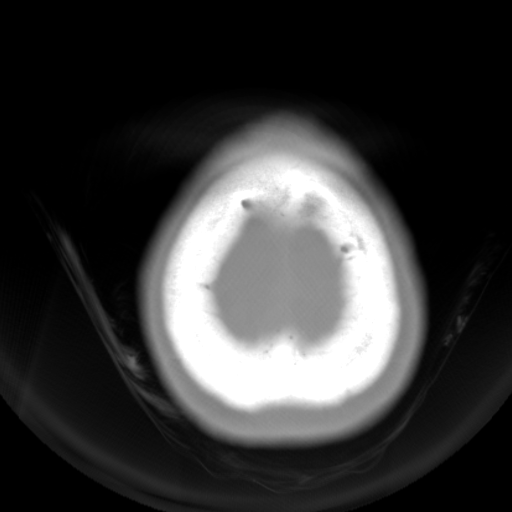
[im 32/32]
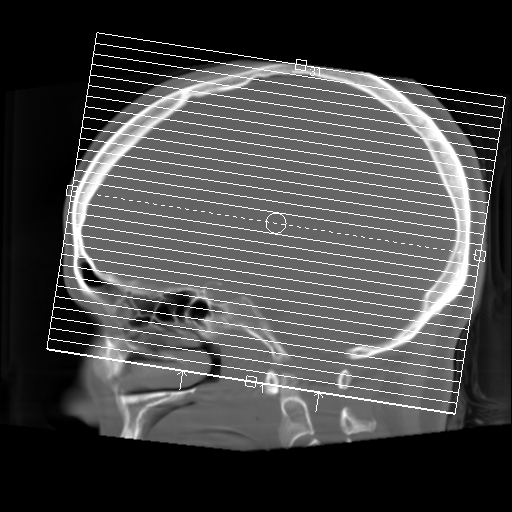

[14 of 24 positions shown; findings below may reference images not displayed]

MEDICATIONS:
15 mg of verapamil intra arterial.

ANESTHESIA/SEDATION:
The procedure was performed under general anesthesia.

CONTRAST:  70 mL Omnipaque 240

FLUOROSCOPY TIME:  Fluoroscopy Time: 64 minutes 42 seconds (1,295
mGy).

COMPLICATIONS:
SIR Level A - No therapy, no consequence.
Using a micropuncture kit and the modified Seldinger technique,
access was gained to the right common femoral artery and an 8 French
sheath was placed.

Under fluoroscopy, an 8 French walrus balloon guide catheter was
navigated over a 6 Eloy Hamm 2 catheter and a 0.035 inch
Terumo Glidewire into the aortic arch. The catheter was placed in
the right common carotid artery and then advanced into the right
internal carotid artery. Frontal and lateral angiograms of the head
were obtained.
FINDINGS: 1. Subocclusive clot in the distal M1 segment of the right MCA
described on prior CT angiogram is no longer seen.
2. Interval migration of the right M2/MCA superior division clot to
an M3 segment.
3. Persistent occlusion of a right M3/MCA middle division branch.
4. Occlusion of 2 M4/MCA posterior division branches.
5. Persistent occlusion of the right pericallosal artery just distal
to the origin of the callosomarginal artery.

PROCEDURE:
Under biplane roadmap, a Catalyst 6 aspiration catheter was
navigated over a phenom 21 microcatheter and a synchro support
microguidewire into the cavernous segment of the right ICA. The
microcatheter was then navigated over the wire into the right M3/MCA
middle division branch. Then, a 3 mm solitaire stent retriever was
deployed spanning the distal M2 and proximal M3/MCA middle division
branch. The device was allowed to intercalated with the clot for 4
minutes. The aspiration catheter was advanced to the M1 segment and
connected to a penumbra aspiration pump. The guiding catheter
balloon was inflated. The thrombectomy device and aspiration
catheter were removed under constant aspiration.

Right ICA angiograms were obtained showing persistent right M3/MCA
middle division branch occlusion. Vasa spasm at the tip of the
balloon guide catheter in the right ICA as well as in the M1 through
M3 segments was noted.

Intra arterial infusion of 5 mg of verapamil was performed over 10
minutes for device induced vasospasm treatment.

Right ICA angiograms with frontal and lateral views of the head were
obtained in showed improvement of the right ICA and MCA vasospasm.

Under biplane roadmap, a Catalyst 6 aspiration catheter was
navigated over a phenom 21 microcatheter and a synchro support
microguidewire into the cavernous segment of the right ICA. The
microcatheter was then navigated over the wire into the right MCA/M3
anterior division branch. Resistance advancing the catheter over the
wire across the occlusion point with stretching of the vessels was
noted. Attempt was aborted.

Right ICA angiograms with frontal and lateral views of the head were
obtained in showed no evidence of active contrast extravasation.

Under biplane roadmap, a 3Max aspiration catheter was navigated over
an Aristotle 14 micro guidewire into the right ACA/A3 segment, at
the level of occlusion. Continuous aspiration was applied for 4
minutes. The catheter was then slowly retracted under continuous
aspiration.

Follow-up right ICA angiograms showed complete pericallosal artery
recanalization.

Flat panel CT of the head was obtained and post processed in a
separate workstation with concurrent attending physician
supervision. Selected images were sent to PACS. Small focus of
subarachnoid hemorrhage was noted in the anterior right frontal
region.

Under biplane roadmap, a 3Max aspiration catheter was navigated over
an Aristotle 14 micro guidewire into theright M3/MCA middle division
branch, at the level of occlusion. Continuous aspiration was applied
for 4 minutes. The catheter was then slowly retracted under
continuous aspiration.

Follow-up right ICA angiograms were obtained showing persistent
occlusion of the right M3/MCA middle division branch.

The guiding catheter was retracted to the level of the right common
carotid artery and a fluoroscopy loop was obtained showing moderate
vasospasm at the upper cervical segment of the right ICA. Intra
arterial infusion of 10 mg of verapamil was performed over 10
minutes for device induced vasospasm treatment. Subsequent
fluoroscopy loop showed resolution of the vasospasm.

Angiogram was obtained via femoral sheath side port showing puncture
at the level of the common femoral artery which has normal caliber.
The sheath was exchanged over the wire for a Perclose ProGlide which
was used for access closure. Immediate hemostasis was achieved
IMPRESSION: 1. Interval fragmentation of the subocclusive right M1/MCA thrombus
and distal migration of the right M2/MCA anterior division branch
clot into and M3 segment. Initial TICI score was 2B and remained the
same despite efforts to recanalize the anterior and medial M3
branches.
2. Persistent occlusion of the right pericallosal artery
successfully recanalized with contact aspiration. Initial TICI score
was 2A and final TICI score was 3.
3. Small focal subarachnoid hemorrhage the right anterior frontal
region.

PLAN:
Patient was transferred to ICU for continued monitoring.

Stroke workup per neurology team.

## 2021-09-29 IMAGING — CT CT HEAD CODE STROKE
4 series · 16 of 47 positions shown, 18 images · non-contrast
Comparison: None.

CLINICAL DATA: Code stroke.  Code stroke with left-sided weakness

EXAM:
CT HEAD WITHOUT CONTRAST
TECHNIQUE: Contiguous axial images were obtained from the base of the skull
through the vertex without intravenous contrast.

[Series 3: head bone · axial · 0.43mm/px · z∈[+1176,+1210]mm · 3 of 87 slices shown]
[im 9/87  bone]
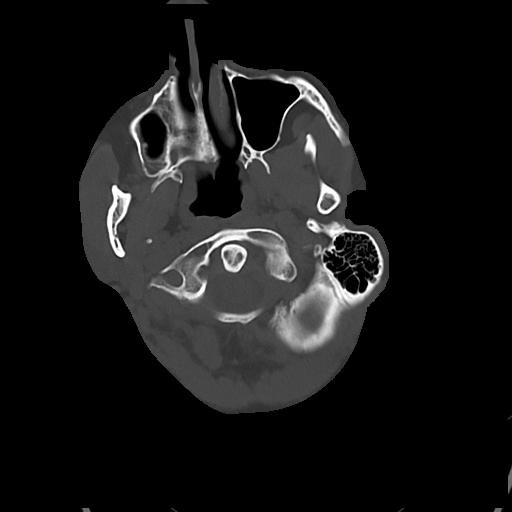
[im 18/87  bone]
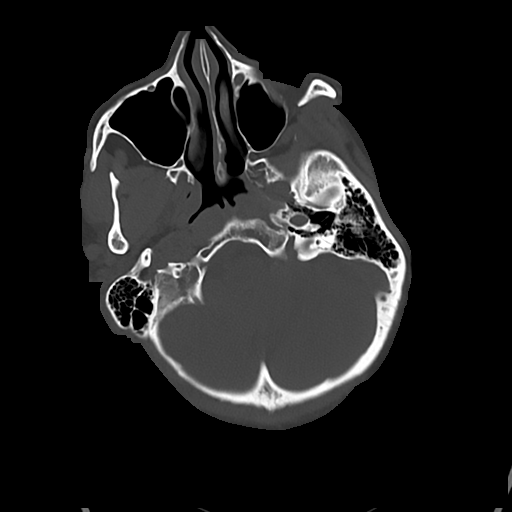
[im 26/87  bone]
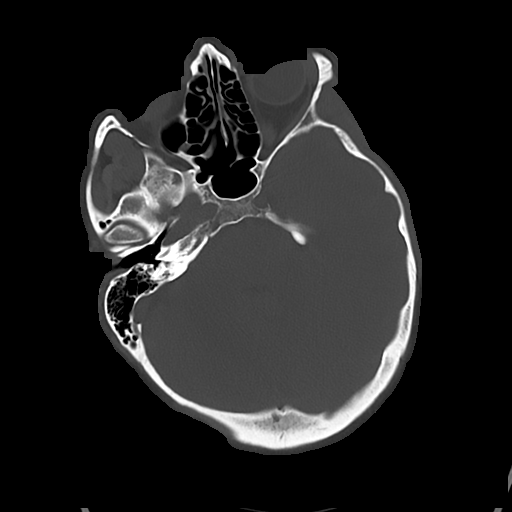

[Series 4: head wo · axial · 0.43mm/px · z∈[+1180,+1306]mm · 7 of 35 slices shown, 9 images]
[im 5/35  brain]
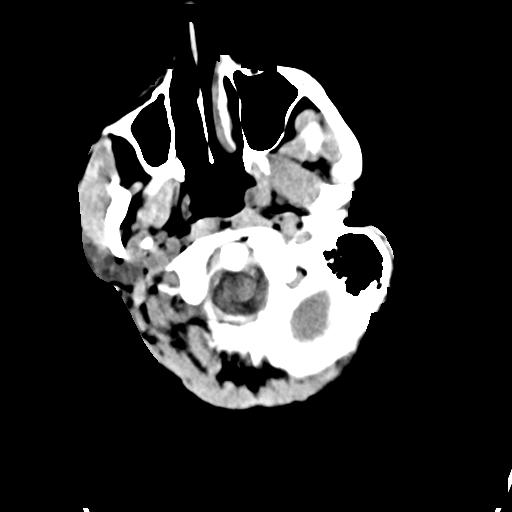
[im 5/35  bone]
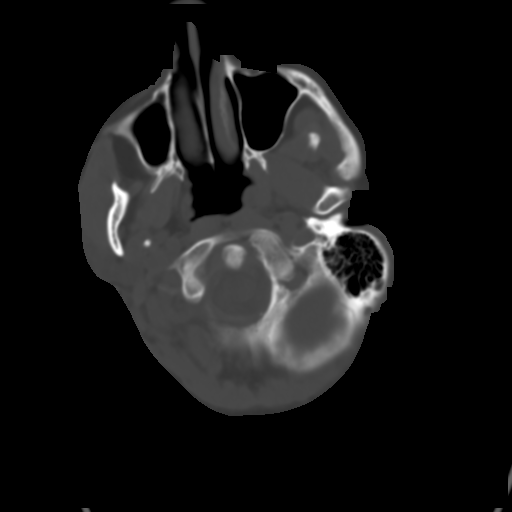
[im 9/35  brain]
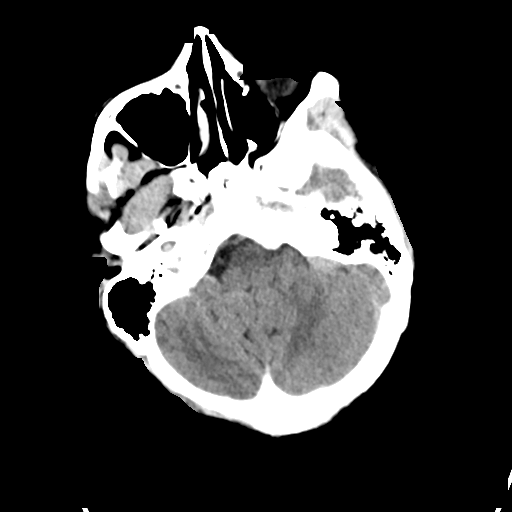
[im 13/35  brain]
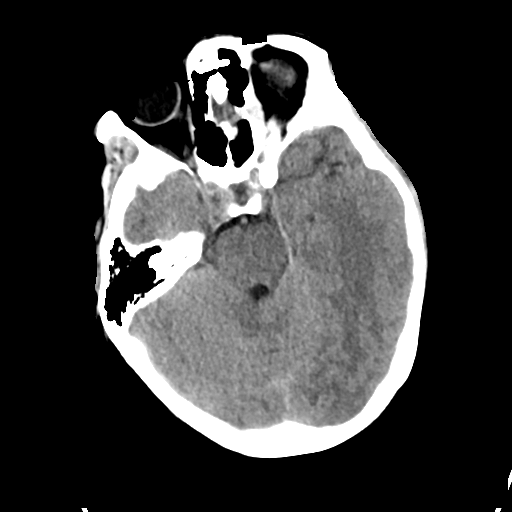
[im 18/35  brain]
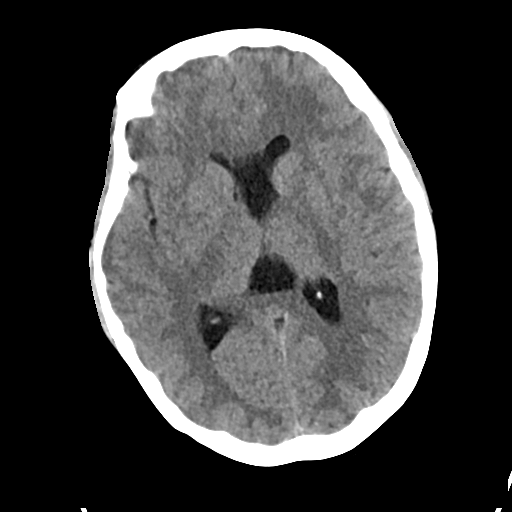
[im 22/35  brain]
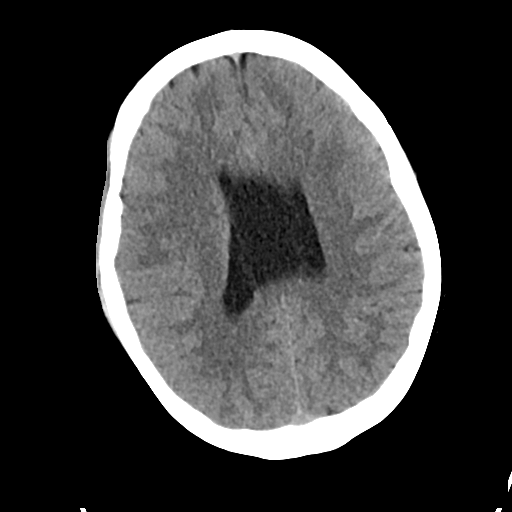
[im 22/35  bone]
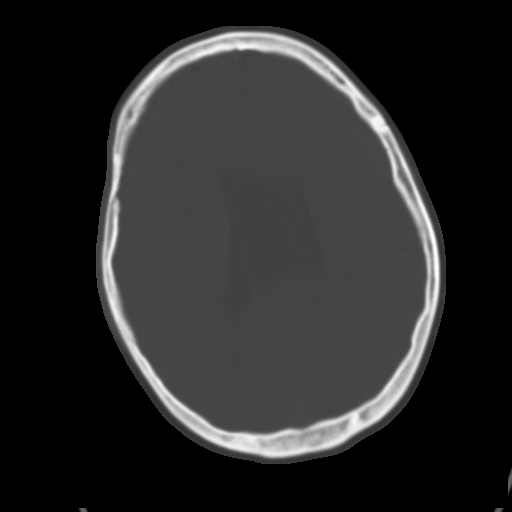
[im 26/35  brain]
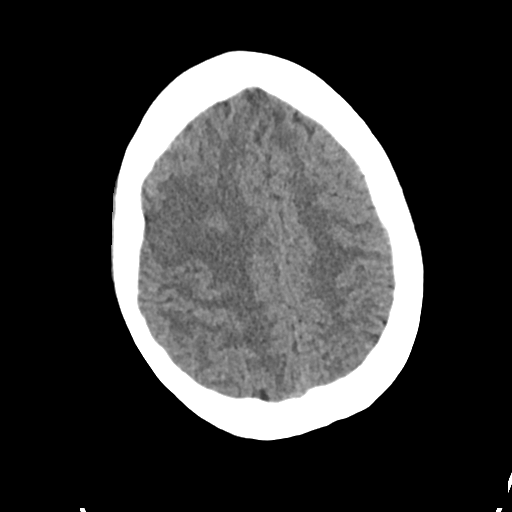
[im 30/35  brain]
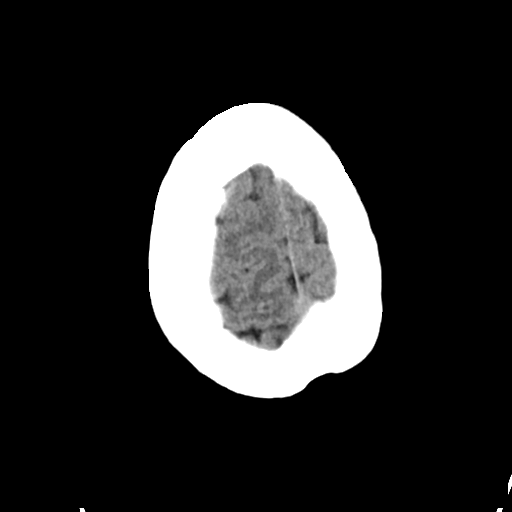

[Series 5: cor soft · coronal · 0.33mm/px · 3 of 69 slices shown]
[im 23/69  brain]
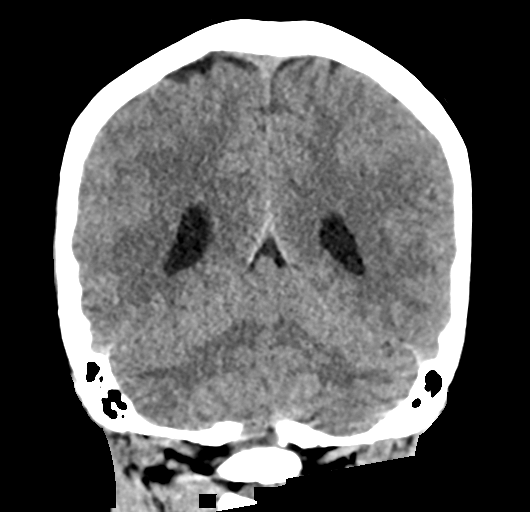
[im 31/69  brain]
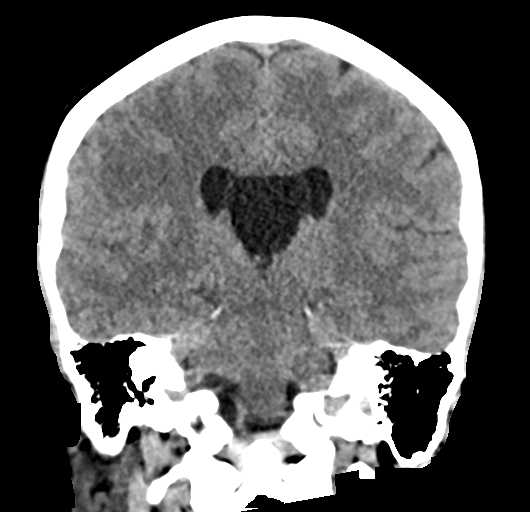
[im 38/69  brain]
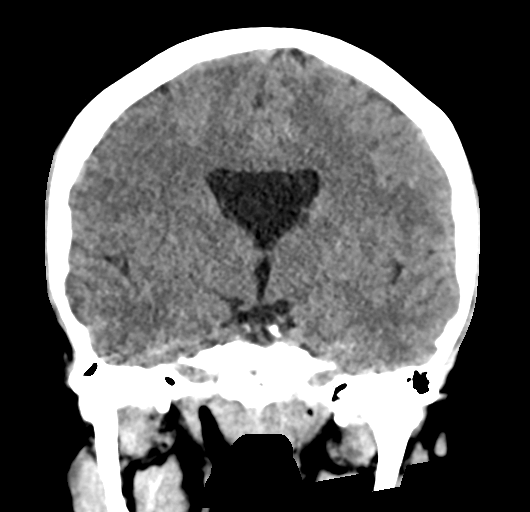

[Series 6: sag soft · sagittal · 0.33mm/px · 3 of 59 slices shown]
[im 24/59  brain]
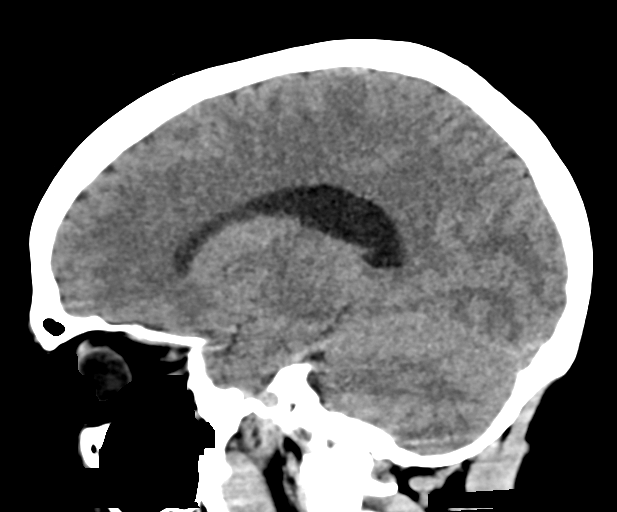
[im 30/59  brain]
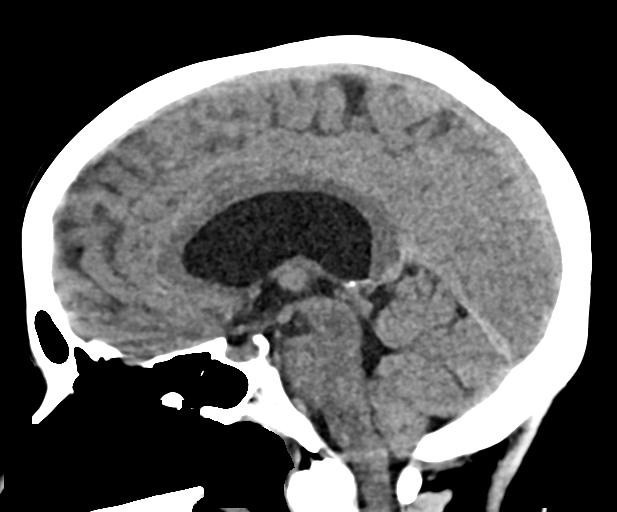
[im 36/59  brain]
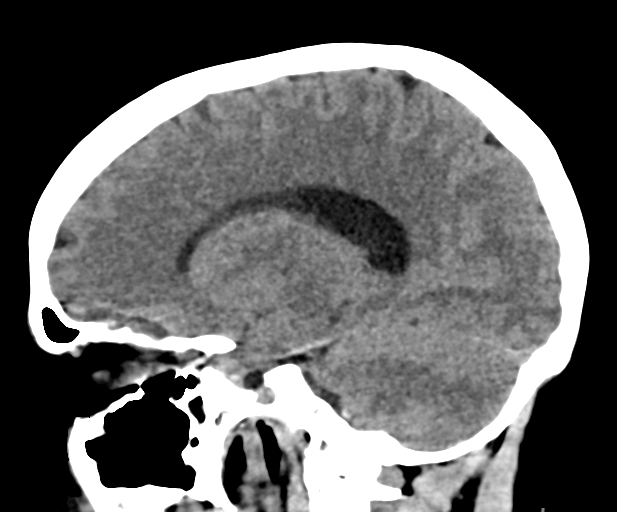

[16 of 47 positions shown; findings below may reference images not displayed]

FINDINGS: Brain: Loss of gray-white differentiation along the lateral right
frontal lobe and superficial right temporal lobe. Equivocal
low-density at the right putamen.

Question small area of gray-white differentiation loss in the high
and parasagittal left frontal lobe.

No acute hemorrhage, hydrocephalus, or masslike finding. There is a
cavum septum pellucidum et vergae

Vascular: Hyperdense right MCA at the bifurcation and M2 level

Skull: Negative

Sinuses/Orbits: Gaze to the right

Other: These results were communicated to Dr. Haura at [DATE]
[place] 01/21/2020by text page via the [employer] messaging system. CTA and
perfusion is already pending.

ASPECTS ([place] Stroke Program Early CT Score)

- Ganglionic level infarction (caudate, lentiform nuclei, internal
capsule, insula, M1-M3 cortex): [DATE]

- Supraganglionic infarction (M4-M6 cortex): 2

Total score (0-10 with 10 being normal): [DATE]
IMPRESSION: 1. Hyperdense right MCA bifurcation.  ASPECTS is 7 or 8.
2. Question small left ACA distribution cortex infarct.

## 2021-12-29 DIAGNOSIS — R0989 Other specified symptoms and signs involving the circulatory and respiratory systems: Secondary | ICD-10-CM | POA: Diagnosis not present

## 2021-12-29 DIAGNOSIS — F419 Anxiety disorder, unspecified: Secondary | ICD-10-CM | POA: Diagnosis not present

## 2021-12-29 DIAGNOSIS — E663 Overweight: Secondary | ICD-10-CM | POA: Diagnosis not present

## 2021-12-29 DIAGNOSIS — J45909 Unspecified asthma, uncomplicated: Secondary | ICD-10-CM | POA: Diagnosis not present

## 2022-01-15 DIAGNOSIS — J45909 Unspecified asthma, uncomplicated: Secondary | ICD-10-CM | POA: Diagnosis not present

## 2022-01-15 DIAGNOSIS — R0989 Other specified symptoms and signs involving the circulatory and respiratory systems: Secondary | ICD-10-CM | POA: Diagnosis not present

## 2022-01-15 DIAGNOSIS — F419 Anxiety disorder, unspecified: Secondary | ICD-10-CM | POA: Diagnosis not present

## 2022-01-15 DIAGNOSIS — Z6827 Body mass index (BMI) 27.0-27.9, adult: Secondary | ICD-10-CM | POA: Diagnosis not present

## 2022-02-09 DIAGNOSIS — F419 Anxiety disorder, unspecified: Secondary | ICD-10-CM | POA: Diagnosis not present

## 2022-02-09 DIAGNOSIS — R0989 Other specified symptoms and signs involving the circulatory and respiratory systems: Secondary | ICD-10-CM | POA: Diagnosis not present

## 2022-02-09 DIAGNOSIS — E663 Overweight: Secondary | ICD-10-CM | POA: Diagnosis not present

## 2022-02-09 DIAGNOSIS — J45909 Unspecified asthma, uncomplicated: Secondary | ICD-10-CM | POA: Diagnosis not present

## 2022-02-24 DIAGNOSIS — R0981 Nasal congestion: Secondary | ICD-10-CM | POA: Diagnosis not present

## 2022-02-24 DIAGNOSIS — J324 Chronic pansinusitis: Secondary | ICD-10-CM | POA: Diagnosis not present

## 2022-02-24 DIAGNOSIS — R519 Headache, unspecified: Secondary | ICD-10-CM | POA: Diagnosis not present

## 2022-04-05 DIAGNOSIS — J45909 Unspecified asthma, uncomplicated: Secondary | ICD-10-CM | POA: Diagnosis not present

## 2022-04-05 DIAGNOSIS — R0989 Other specified symptoms and signs involving the circulatory and respiratory systems: Secondary | ICD-10-CM | POA: Diagnosis not present

## 2022-04-05 DIAGNOSIS — F419 Anxiety disorder, unspecified: Secondary | ICD-10-CM | POA: Diagnosis not present

## 2022-04-05 DIAGNOSIS — E663 Overweight: Secondary | ICD-10-CM | POA: Diagnosis not present

## 2022-05-24 DIAGNOSIS — Z30431 Encounter for routine checking of intrauterine contraceptive device: Secondary | ICD-10-CM | POA: Diagnosis not present

## 2022-08-11 DIAGNOSIS — J069 Acute upper respiratory infection, unspecified: Secondary | ICD-10-CM | POA: Diagnosis not present

## 2022-08-11 DIAGNOSIS — F419 Anxiety disorder, unspecified: Secondary | ICD-10-CM | POA: Diagnosis not present

## 2022-08-11 DIAGNOSIS — J309 Allergic rhinitis, unspecified: Secondary | ICD-10-CM | POA: Diagnosis not present

## 2022-08-11 DIAGNOSIS — E7800 Pure hypercholesterolemia, unspecified: Secondary | ICD-10-CM | POA: Diagnosis not present

## 2022-09-07 DIAGNOSIS — E7800 Pure hypercholesterolemia, unspecified: Secondary | ICD-10-CM | POA: Diagnosis not present

## 2022-09-07 DIAGNOSIS — F419 Anxiety disorder, unspecified: Secondary | ICD-10-CM | POA: Diagnosis not present

## 2022-09-07 DIAGNOSIS — J309 Allergic rhinitis, unspecified: Secondary | ICD-10-CM | POA: Diagnosis not present

## 2022-09-07 DIAGNOSIS — J069 Acute upper respiratory infection, unspecified: Secondary | ICD-10-CM | POA: Diagnosis not present

## 2022-09-27 DIAGNOSIS — J309 Allergic rhinitis, unspecified: Secondary | ICD-10-CM | POA: Diagnosis not present

## 2022-09-27 DIAGNOSIS — J069 Acute upper respiratory infection, unspecified: Secondary | ICD-10-CM | POA: Diagnosis not present

## 2022-09-27 DIAGNOSIS — F419 Anxiety disorder, unspecified: Secondary | ICD-10-CM | POA: Diagnosis not present

## 2022-09-27 DIAGNOSIS — E7800 Pure hypercholesterolemia, unspecified: Secondary | ICD-10-CM | POA: Diagnosis not present

## 2022-11-09 DIAGNOSIS — J309 Allergic rhinitis, unspecified: Secondary | ICD-10-CM | POA: Diagnosis not present

## 2022-11-09 DIAGNOSIS — J0100 Acute maxillary sinusitis, unspecified: Secondary | ICD-10-CM | POA: Diagnosis not present

## 2022-11-09 DIAGNOSIS — E7800 Pure hypercholesterolemia, unspecified: Secondary | ICD-10-CM | POA: Diagnosis not present

## 2022-11-09 DIAGNOSIS — F419 Anxiety disorder, unspecified: Secondary | ICD-10-CM | POA: Diagnosis not present

## 2023-01-11 DIAGNOSIS — E7800 Pure hypercholesterolemia, unspecified: Secondary | ICD-10-CM | POA: Diagnosis not present

## 2023-01-11 DIAGNOSIS — J309 Allergic rhinitis, unspecified: Secondary | ICD-10-CM | POA: Diagnosis not present

## 2023-01-11 DIAGNOSIS — J069 Acute upper respiratory infection, unspecified: Secondary | ICD-10-CM | POA: Diagnosis not present

## 2023-01-11 DIAGNOSIS — F419 Anxiety disorder, unspecified: Secondary | ICD-10-CM | POA: Diagnosis not present

## 2023-01-14 DIAGNOSIS — S93402A Sprain of unspecified ligament of left ankle, initial encounter: Secondary | ICD-10-CM | POA: Diagnosis not present

## 2023-06-14 DIAGNOSIS — M26602 Left temporomandibular joint disorder, unspecified: Secondary | ICD-10-CM | POA: Diagnosis not present

## 2023-06-14 DIAGNOSIS — R6884 Jaw pain: Secondary | ICD-10-CM | POA: Diagnosis not present

## 2023-06-14 DIAGNOSIS — M6281 Muscle weakness (generalized): Secondary | ICD-10-CM | POA: Diagnosis not present

## 2023-06-29 DIAGNOSIS — M26602 Left temporomandibular joint disorder, unspecified: Secondary | ICD-10-CM | POA: Diagnosis not present

## 2023-06-29 DIAGNOSIS — R6884 Jaw pain: Secondary | ICD-10-CM | POA: Diagnosis not present

## 2023-06-29 DIAGNOSIS — M6281 Muscle weakness (generalized): Secondary | ICD-10-CM | POA: Diagnosis not present

## 2023-07-05 DIAGNOSIS — E7800 Pure hypercholesterolemia, unspecified: Secondary | ICD-10-CM | POA: Diagnosis not present

## 2023-07-05 DIAGNOSIS — F419 Anxiety disorder, unspecified: Secondary | ICD-10-CM | POA: Diagnosis not present

## 2023-07-05 DIAGNOSIS — R7989 Other specified abnormal findings of blood chemistry: Secondary | ICD-10-CM | POA: Diagnosis not present

## 2023-07-05 DIAGNOSIS — N644 Mastodynia: Secondary | ICD-10-CM | POA: Diagnosis not present

## 2023-07-06 DIAGNOSIS — M6281 Muscle weakness (generalized): Secondary | ICD-10-CM | POA: Diagnosis not present

## 2023-07-06 DIAGNOSIS — R6884 Jaw pain: Secondary | ICD-10-CM | POA: Diagnosis not present

## 2023-07-06 DIAGNOSIS — M26602 Left temporomandibular joint disorder, unspecified: Secondary | ICD-10-CM | POA: Diagnosis not present

## 2023-07-31 DIAGNOSIS — M26602 Left temporomandibular joint disorder, unspecified: Secondary | ICD-10-CM | POA: Diagnosis not present

## 2023-07-31 DIAGNOSIS — M6281 Muscle weakness (generalized): Secondary | ICD-10-CM | POA: Diagnosis not present

## 2023-07-31 DIAGNOSIS — R6884 Jaw pain: Secondary | ICD-10-CM | POA: Diagnosis not present

## 2023-08-01 DIAGNOSIS — N644 Mastodynia: Secondary | ICD-10-CM | POA: Diagnosis not present

## 2023-08-01 DIAGNOSIS — E7800 Pure hypercholesterolemia, unspecified: Secondary | ICD-10-CM | POA: Diagnosis not present

## 2023-08-01 DIAGNOSIS — N390 Urinary tract infection, site not specified: Secondary | ICD-10-CM | POA: Diagnosis not present

## 2023-08-01 DIAGNOSIS — R7989 Other specified abnormal findings of blood chemistry: Secondary | ICD-10-CM | POA: Diagnosis not present

## 2023-09-04 DIAGNOSIS — M6281 Muscle weakness (generalized): Secondary | ICD-10-CM | POA: Diagnosis not present

## 2023-09-04 DIAGNOSIS — M26602 Left temporomandibular joint disorder, unspecified: Secondary | ICD-10-CM | POA: Diagnosis not present

## 2023-09-04 DIAGNOSIS — R6884 Jaw pain: Secondary | ICD-10-CM | POA: Diagnosis not present

## 2023-09-21 DIAGNOSIS — J069 Acute upper respiratory infection, unspecified: Secondary | ICD-10-CM | POA: Diagnosis not present

## 2023-09-21 DIAGNOSIS — R7989 Other specified abnormal findings of blood chemistry: Secondary | ICD-10-CM | POA: Diagnosis not present

## 2023-09-21 DIAGNOSIS — N644 Mastodynia: Secondary | ICD-10-CM | POA: Diagnosis not present

## 2023-09-21 DIAGNOSIS — B3731 Acute candidiasis of vulva and vagina: Secondary | ICD-10-CM | POA: Diagnosis not present
